# Patient Record
Sex: Male | Born: 1980 | Race: White | Hispanic: No | Marital: Married | Smoking: Current every day smoker
Health system: Southern US, Community
[De-identification: ages and names within clinical notes are randomized; demographics above are authoritative.]

## PROBLEM LIST (undated history)

## (undated) DIAGNOSIS — G894 Chronic pain syndrome: Secondary | ICD-10-CM

## (undated) DIAGNOSIS — G629 Polyneuropathy, unspecified: Secondary | ICD-10-CM

## (undated) HISTORY — PX: ELBOW SURGERY: SHX618

## (undated) HISTORY — PX: HIP SURGERY: SHX245

---

## 2013-02-26 DIAGNOSIS — I10 Essential (primary) hypertension: Secondary | ICD-10-CM | POA: Insufficient documentation

## 2017-06-08 DIAGNOSIS — G9050 Complex regional pain syndrome I, unspecified: Secondary | ICD-10-CM | POA: Insufficient documentation

## 2017-10-09 DIAGNOSIS — K0889 Other specified disorders of teeth and supporting structures: Secondary | ICD-10-CM | POA: Insufficient documentation

## 2017-10-09 DIAGNOSIS — Z720 Tobacco use: Secondary | ICD-10-CM | POA: Insufficient documentation

## 2018-12-26 ENCOUNTER — Ambulatory Visit
Admission: EM | Admit: 2018-12-26 | Discharge: 2018-12-26 | Disposition: A | Discharge status: Home / Self Care | Payer: 59

## 2018-12-26 ENCOUNTER — Telehealth: Payer: Self-pay

## 2018-12-26 ENCOUNTER — Other Ambulatory Visit: Payer: Self-pay

## 2018-12-26 DIAGNOSIS — R29898 Other symptoms and signs involving the musculoskeletal system: Secondary | ICD-10-CM

## 2018-12-26 DIAGNOSIS — M25512 Pain in left shoulder: Secondary | ICD-10-CM

## 2018-12-26 DIAGNOSIS — M542 Cervicalgia: Secondary | ICD-10-CM

## 2018-12-26 DIAGNOSIS — M62838 Other muscle spasm: Secondary | ICD-10-CM

## 2018-12-26 HISTORY — DX: Polyneuropathy, unspecified: G62.9

## 2018-12-26 MED ORDER — CYCLOBENZAPRINE HCL 10 MG PO TABS: 10.0000 mg | ORAL_TABLET | Freq: Every day | ORAL | 0 refills | Status: DC

## 2018-12-26 MED ORDER — PREDNISONE 10 MG PO TABS: ORAL_TABLET | ORAL | 0 refills | Status: DC

## 2018-12-26 NOTE — ED Triage Notes (Signed)
Pt presents to UC w/ left shoulder pain x4 days that shoots down left arm at times. Pt states movement makes pain worse. Pt states muscle spasms have started in his left chest muscle today. Denies sob.

## 2018-12-26 NOTE — ED Provider Notes (Signed)
[facility]     MRN: [id number] DOB: 02/20/1980  Subjective:   Marc Peterson is a 38 y.o. male presenting for 4 day hx acute onset of left sided lower neck pain that spans across his upper left back and penetrates into underarm area. Sx are getting worse. Has used lidocaine, has muscle spasms, radiates into lateral chest. Patient is not working. Does not do any strenuous physical activities, no falls or trauma. Sx started when he awoke in the morning 4 days ago. 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 He has typically had symptoms on his right side for this.   No current facility-administered medications for this encounter.   Current Outpatient Medications:  .  gabapentin (NEURONTIN) 800 MG tablet, Take 800 mg by mouth 4 (four) times daily., Disp: , Rfl:  .  cyclobenzaprine (FLEXERIL) 10 MG tablet, Take 1 tablet (10 mg total) by mouth at bedtime., Disp: 30 tablet, Rfl: 0 .  predniSONE (DELTASONE) 10 MG tablet, Day 1-5: Take 6 tablets daily. Day 6-10: take 3 tablets daily. Take medication with breakfast., Disp: 45 tablet, Rfl: 0   Allergies  Allergen Reactions  . Vioxx [Rofecoxib]     Past Medical History:  Diagnosis Date  . Neuropathy      Past Surgical History:  Procedure Laterality Date  . ELBOW SURGERY     right  . HIP SURGERY     x2, right hip    Family History  Problem Relation Age of Onset  . Cancer Mother   . Cancer Father     Social History   Tobacco Use  . Smoking status: Current Every Day Smoker    Packs/day: 1.00    Types: Cigarettes  . Smokeless tobacco: Never Used  Substance Use Topics  . Alcohol use: Yes    Comment: wine occasionally  . Drug use: Never    Review of Systems  Constitutional: Negative for fever and malaise/fatigue.  HENT: Negative for congestion, ear pain, sinus pain and sore throat.   Eyes: Negative for discharge and redness.  Respiratory: Negative for cough, hemoptysis, shortness of breath and  wheezing.   Cardiovascular: Positive for chest wall/muscle pain  Gastrointestinal: Negative for abdominal pain, diarrhea, nausea and vomiting.  Genitourinary: Negative for dysuria, flank pain and hematuria.  Musculoskeletal: As above.  Skin: Negative for rash.  Neurological: Negative for dizziness, weakness and headaches.  Psychiatric/Behavioral: Negative for depression and substance abuse.    Objective:   Vitals: BP (!) 145/91 (BP Location: Right Arm)   Pulse 95   Temp 98.5 F (36.9 C) (Oral)   Resp 16   SpO2 96%   Physical Exam Constitutional:      General: He is not in acute distress.    Appearance: Normal appearance. He is well-developed. He is not ill-appearing, toxic-appearing or diaphoretic.  HENT:     Head: Normocephalic and atraumatic.     Right Ear: External ear normal.     Left Ear: External ear normal.     Nose: Nose normal.     Mouth/Throat:     Mouth: Mucous membranes are moist.     Pharynx: Oropharynx is clear.  Eyes:     General: No scleral icterus.    Extraocular Movements: Extraocular movements intact.     Pupils: Pupils are equal, round, and reactive to light.  Cardiovascular:     Rate and Rhythm: Normal rate and regular rhythm.     Heart sounds: Normal heart sounds. No murmur. No friction  rub. No gallop.   Pulmonary:     Effort: Pulmonary effort is normal. No respiratory distress.     Breath sounds: Normal breath sounds. No stridor. No wheezing, rhonchi or rales.  Chest:     Chest wall: Tenderness (over areas outlined) present.    Musculoskeletal:     Left shoulder: He exhibits decreased range of motion (due to hesitation from his pain), tenderness (superficial along deltoid laterally and at [facility] joint), bony tenderness and spasm. He exhibits no swelling, no effusion, no crepitus, no deformity and normal strength.     Cervical back: He exhibits decreased range of motion (extension), tenderness and spasm (cervical paraspinal muscles). He exhibits no  bony tenderness, no swelling, no edema and no deformity.       Back:  Neurological:     Mental Status: He is alert and oriented to person, place, and time.     Cranial Nerves: No cranial nerve deficit.     Motor: No weakness.     Coordination: Coordination normal.     Deep Tendon Reflexes: Reflexes normal.  Psychiatric:        Mood and Affect: Mood normal.        Behavior: Behavior normal.        Thought Content: Thought content normal.        Judgment: Judgment normal.     Assessment and Plan :   1. Neck pain   2. Acute pain of left shoulder   3. Weakness of left hand   4. Muscle spasm     I do not suspect cardiopulmonary source for patient's symptoms given his lack of risk factors, age.  He also has a history of CRPS.  His symptoms are more consistent with variation of his CRPS, musculoskeletal type pain.  Due to the possibility of cervical radiculopathy, will use prednisone course and the muscle relaxant especially in light of the severity of his pain.  X-rays for the neck and shoulder are pending.  Strict ER precautions. Counseled patient on potential for adverse effects with medications prescribed today, patient verbalized understanding.    Jaynee Eagles, PA-C 12/26/18 [phone]

## 2018-12-27 ENCOUNTER — Ambulatory Visit (HOSPITAL_COMMUNITY)
Admission: RE | Admit: 2018-12-27 | Discharge: 2018-12-27 | Disposition: A | Discharge status: Home / Self Care | Payer: 59 | Source: Ambulatory Visit | Attending: Urgent Care | Admitting: Urgent Care

## 2018-12-27 DIAGNOSIS — M25512 Pain in left shoulder: Secondary | ICD-10-CM | POA: Insufficient documentation

## 2018-12-27 DIAGNOSIS — M542 Cervicalgia: Secondary | ICD-10-CM | POA: Insufficient documentation

## 2019-03-01 ENCOUNTER — Other Ambulatory Visit: Payer: Self-pay

## 2019-03-01 ENCOUNTER — Ambulatory Visit
Admission: EM | Admit: 2019-03-01 | Discharge: 2019-03-01 | Disposition: A | Discharge status: Home / Self Care | Payer: 59 | Attending: Emergency Medicine | Admitting: Emergency Medicine

## 2019-03-01 DIAGNOSIS — Z760 Encounter for issue of repeat prescription: Secondary | ICD-10-CM

## 2019-03-01 MED ORDER — NEURONTIN 800 MG PO TABS: 800.0000 mg | ORAL_TABLET | Freq: Four times a day (QID) | ORAL | 1 refills | Status: DC

## 2019-03-01 NOTE — ED Provider Notes (Signed)
[facility]    CSN: [id number] Arrival date & time: 03/01/19  1353      History   Chief Complaint Chief Complaint  Patient presents with  . Medication Refill    HPI Marc Peterson is a 39 y.o. male.   who presents for  medication refill.  Hx of RSD and CRPS.  States he has been on gabapentin 800 mg 4 times a day as a treatment plan for years.  From [place] and does not have a PCP.  Denies HA, vision changes, dizziness, lightheadedness, chest pain, shortness of breath, numbness or tingling in extremities, abdominal pain, changes in bowel or bladder habits.    The history is provided by the patient. No language interpreter was used.  Medication Refill   Past Medical History:  Diagnosis Date  . Neuropathy     There are no problems to display for this patient.   Past Surgical History:  Procedure Laterality Date  . ELBOW SURGERY     right  . HIP SURGERY     x2, right hip       Home Medications    Prior to Admission medications   Medication Sig Start Date End Date Taking? Authorizing Provider  cyclobenzaprine (FLEXERIL) 10 MG tablet Take 1 tablet (10 mg total) by mouth at bedtime. 12/26/18   Jaynee Eagles, PA-C  gabapentin (NEURONTIN) 800 MG tablet Take 800 mg by mouth 4 (four) times daily.    [provider]  predniSONE (DELTASONE) 10 MG tablet Day 1-5: Take 6 tablets daily. Day 6-10: take 3 tablets daily. Take medication with breakfast. 12/26/18   Jaynee Eagles, PA-C    Family History Family History  Problem Relation Age of Onset  . Cancer Mother   . Cancer Father     Social History Social History   Tobacco Use  . Smoking status: Current Every Day Smoker    Packs/day: 1.00    Types: Cigarettes  . Smokeless tobacco: Never Used  Substance Use Topics  . Alcohol use: Yes    Comment: wine occasionally  . Drug use: Never     Allergies   Vioxx [rofecoxib]   Review of Systems Review of Systems  Constitutional: Negative.     Respiratory: Negative.   Cardiovascular: Negative.   Neurological: Negative.   All other systems reviewed and are negative.    Physical Exam Triage Vital Signs ED Triage Vitals  Enc Vitals Group     BP      Pulse      Resp      Temp      Temp src      SpO2      Weight      Height      Head Circumference      Peak Flow      Pain Score      Pain Loc      Pain Edu?      Excl. in [place]?    No data found.  Updated Vital Signs There were no vitals taken for this visit.  Visual Acuity Right Eye Distance:   Left Eye Distance:   Bilateral Distance:    Right Eye Near:   Left Eye Near:    Bilateral Near:     Physical Exam Vitals and nursing note reviewed.  Constitutional:      General: He is not in acute distress.    Appearance: Normal appearance. He is normal weight. He is not ill-appearing, toxic-appearing or diaphoretic.  Cardiovascular:     Rate and Rhythm: Normal rate and regular rhythm.     Pulses: Normal pulses.     Heart sounds: Normal heart sounds. No murmur. No gallop.   Pulmonary:     Effort: Pulmonary effort is normal. No respiratory distress.     Breath sounds: Normal breath sounds. No stridor. No wheezing, rhonchi or rales.  Chest:     Chest wall: No tenderness.  Neurological:     General: No focal deficit present.     Mental Status: He is alert and oriented to person, place, and time.  Psychiatric:        Behavior: Behavior normal.      UC Treatments / Results  Labs (all labs ordered are listed, but only abnormal results are displayed) Labs Reviewed - No data to display  EKG   Radiology No results found.  Procedures Procedures (including critical care time)  Medications Ordered in UC Medications - No data to display  Initial Impression / Assessment and Plan / UC Course  I have reviewed the triage vital signs and the nursing notes.  Pertinent labs & imaging results that were available during my care of the patient were reviewed by  me and considered in my medical decision making (see chart for details).    Medication refill Gabapentin was prescribed Advised patient to establish care with PCP To return for worsening symptoms  Final Clinical Impressions(s) / UC Diagnoses   Final diagnoses:  Encounter for medication refill     Discharge Instructions     Gabapentin was prescribed Advised patient to establish care with a PCP    ED Prescriptions    None     PDMP not reviewed this encounter.   Durward Parcel, FNP 03/01/19 1411

## 2019-03-01 NOTE — ED Triage Notes (Signed)
Pt needs refill for gabapentin. Pt lives in [place] but has been staying here to help take care of his mom and is unable to contact pcp

## 2019-03-01 NOTE — Discharge Instructions (Addendum)
Gabapentin was prescribed Advised patient to establish care with a PCP

## 2020-02-14 ENCOUNTER — Other Ambulatory Visit: Payer: Self-pay

## 2020-02-14 ENCOUNTER — Ambulatory Visit
Admission: EM | Admit: 2020-02-14 | Discharge: 2020-02-14 | Disposition: A | Discharge status: Home / Self Care | Payer: 59 | Attending: Emergency Medicine | Admitting: Emergency Medicine

## 2020-02-14 ENCOUNTER — Encounter: Payer: Self-pay | Admitting: Emergency Medicine

## 2020-02-14 DIAGNOSIS — B9789 Other viral agents as the cause of diseases classified elsewhere: Secondary | ICD-10-CM | POA: Diagnosis not present

## 2020-02-14 DIAGNOSIS — H6593 Unspecified nonsuppurative otitis media, bilateral: Secondary | ICD-10-CM

## 2020-02-14 DIAGNOSIS — J329 Chronic sinusitis, unspecified: Secondary | ICD-10-CM

## 2020-02-14 DIAGNOSIS — Z1152 Encounter for screening for COVID-19: Secondary | ICD-10-CM

## 2020-02-14 MED ORDER — FLUTICASONE PROPIONATE 50 MCG/ACT NA SUSP: 1.0000 | Freq: Every day | NASAL | 0 refills | Status: DC

## 2020-02-14 MED ORDER — PREDNISONE 10 MG PO TABS: 20.0000 mg | ORAL_TABLET | Freq: Every day | ORAL | 0 refills | Status: DC

## 2020-02-14 NOTE — ED Provider Notes (Signed)
[facility]   [id number] 02/14/20 Arrival Time: 1553  CC:EAR PAIN  SUBJECTIVE: History from: patient and family.  Marc Peterson is a 40 y.o. male who presented to the urgent care with a complaint of left ear pain, scratchy throat, and sinus pressure for the past 4 to 5 days.  Denies a precipitating event, such as swimming or wearing ear plugs.  Patient states the pain is constant and achy in character.  Patient has tried OTC medication without relief.  Symptoms are made worse with lying down.  Denies similar symptoms in the past.    Denies fever, chills, fatigue, sinus pain, rhinorrhea, ear discharge, sore throat, SOB, wheezing, chest pain, nausea, changes in bowel or bladder habits.    ROS: As per HPI.  All other pertinent ROS negative.     Past Medical History:  Diagnosis Date  . Neuropathy    Past Surgical History:  Procedure Laterality Date  . ELBOW SURGERY     right  . HIP SURGERY     x2, right hip   Allergies  Allergen Reactions  . Vioxx [Rofecoxib]    No current facility-administered medications on file prior to encounter.   Current Outpatient Medications on File Prior to Encounter  Medication Sig Dispense Refill  . cyclobenzaprine (FLEXERIL) 10 MG tablet Take 1 tablet (10 mg total) by mouth at bedtime. 30 tablet 0  . NEURONTIN 800 MG tablet Take 1 tablet (800 mg total) by mouth in the morning, at noon, in the evening, and at bedtime. 120 tablet 1   Social History   Socioeconomic History  . Marital status: Married    Spouse name: Not on file  . Number of children: Not on file  . Years of education: Not on file  . Highest education level: Not on file  Occupational History  . Not on file  Tobacco Use  . Smoking status: Current Every Day Smoker    Packs/day: 1.00    Types: Cigarettes  . Smokeless tobacco: Never Used  Substance and Sexual Activity  . Alcohol use: Yes    Comment: wine occasionally  . Drug use: Never  . Sexual activity: Not on  file  Other Topics Concern  . Not on file  Social History Narrative  . Not on file   Social Determinants of Health   Financial Resource Strain: Not on file  Food Insecurity: Not on file  Transportation Needs: Not on file  Physical Activity: Not on file  Stress: Not on file  Social Connections: Not on file  Intimate Partner Violence: Not on file   Family History  Problem Relation Age of Onset  . Cancer Mother   . Cancer Father     OBJECTIVE:  Vitals:   02/14/20 1602 02/14/20 1604  BP:  135/86  Pulse:  73  Resp:  18  Temp:  98.5 F (36.9 C)  TempSrc:  Oral  SpO2:  96%  Weight: 190 lb (86.2 kg)   Height: 6\' 2"  (1.88 m)      Physical Exam Vitals and nursing note reviewed.  Constitutional:      General: He is not in acute distress.    Appearance: Normal appearance. He is normal weight. He is not ill-appearing, toxic-appearing or diaphoretic.  HENT:     Right Ear: Ear canal and external ear normal. A middle ear effusion is present. There is no impacted cerumen.     Left Ear: Ear canal and external ear normal. A middle ear effusion is present.  There is no impacted cerumen.     Nose: No congestion.     Right Sinus: Maxillary sinus tenderness present.     Left Sinus: Maxillary sinus tenderness present.  Cardiovascular:     Rate and Rhythm: Normal rate and regular rhythm.     Pulses: Normal pulses.     Heart sounds: Normal heart sounds. No murmur heard. No friction rub. No gallop.   Pulmonary:     Effort: Pulmonary effort is normal. No respiratory distress.     Breath sounds: Normal breath sounds. No stridor. No wheezing, rhonchi or rales.  Chest:     Chest wall: No tenderness.  Neurological:     Mental Status: He is alert and oriented to person, place, and time.     Imaging: No results found.   ASSESSMENT & PLAN:  1. Middle ear effusion, bilateral   2. Encounter for screening for COVID-19   3. Viral sinusitis     Meds ordered this encounter   Medications  . fluticasone (FLONASE) 50 MCG/ACT nasal spray    Sig: Place 1 spray into both nostrils daily for 14 days.    Dispense:  16 g    Refill:  0  . predniSONE (DELTASONE) 10 MG tablet    Sig: Take 2 tablets (20 mg total) by mouth daily.    Dispense:  15 tablet    Refill:  0   Discharge instructions  COVID-19 testing ordered.  It will take 2 to 7 days for results to return and someone will call if your result is positive.  Rest and drink plenty of fluids Prescribed Flonase and prednisone Use OTC Halls lozenges to soothe throat Gargle with salty warm water daily Take medications as directed and to completion Continue to use OTC ibuprofen and/ or tylenol as needed for pain control Follow up with PCP if symptoms persists Return here or go to the ER if you have any new or worsening symptoms   Reviewed expectations re: course of current medical issues. Questions answered. Outlined signs and symptoms indicating need for more acute intervention. Patient verbalized understanding. After Visit Summary given.         Durward Parcel, FNP 02/14/20 [phone]

## 2020-02-14 NOTE — Discharge Instructions (Signed)
COVID-19 testing ordered.  It will take 2 to 7 days for results to return and someone will call if your result is positive.  Rest and drink plenty of fluids Prescribed Flonase and prednisone Use OTC Halls lozenges to soothe throat Gargle with salty warm water daily Take medications as directed and to completion Continue to use OTC ibuprofen and/ or tylenol as needed for pain control Follow up with PCP if symptoms persists Return here or go to the ER if you have any new or worsening symptom

## 2020-02-14 NOTE — ED Triage Notes (Signed)
LT ear pain since Monday

## 2020-02-16 LAB — NOVEL CORONAVIRUS, NAA: SARS-CoV-2, NAA: DETECTED — AB

## 2020-02-16 LAB — SARS-COV-2, NAA 2 DAY TAT

## 2020-02-17 ENCOUNTER — Telehealth: Payer: Self-pay | Admitting: *Deleted

## 2020-02-17 NOTE — Telephone Encounter (Signed)
Called to discuss with patient about COVID-19 symptoms and the use of one of the available treatments for those with mild to moderate Covid symptoms and at a high risk of hospitalization.  Pt appears to qualify for outpatient treatment due to co-morbid conditions and/or a member of an at-risk group in accordance with the FDA Emergency Use Authorization.    Symptom onset:  Vaccinated:  Booster?  Immunocompromised?  Qualifiers:   Unable to reach pt - Left VM to return call for information  Vallery Mcdade Kaye   

## 2020-03-11 ENCOUNTER — Other Ambulatory Visit: Payer: Self-pay

## 2020-03-11 ENCOUNTER — Ambulatory Visit
Admission: EM | Admit: 2020-03-11 | Discharge: 2020-03-11 | Disposition: A | Discharge status: Home / Self Care | Payer: 59 | Attending: Physician Assistant | Admitting: Physician Assistant

## 2020-03-11 ENCOUNTER — Encounter: Payer: Self-pay | Admitting: Emergency Medicine

## 2020-03-11 DIAGNOSIS — H6691 Otitis media, unspecified, right ear: Secondary | ICD-10-CM

## 2020-03-11 MED ORDER — CEFDINIR 300 MG PO CAPS: 300.0000 mg | ORAL_CAPSULE | Freq: Two times a day (BID) | ORAL | 0 refills | Status: DC

## 2020-03-11 NOTE — Discharge Instructions (Signed)
Return if any problems.

## 2020-03-11 NOTE — ED Triage Notes (Signed)
Sore throat with RT ear pain x  1 week.

## 2020-03-13 NOTE — ED Provider Notes (Signed)
[facility]    CSN: [id number] Arrival date & time: 03/11/20  1051      History   Chief Complaint No chief complaint on file.   HPI Marc Peterson is a 40 y.o. male.   The history is provided by the patient. No language interpreter was used.  Otalgia Location:  Right Quality:  Aching Severity:  Moderate Onset quality:  Gradual Timing:  Constant Progression:  Worsening Chronicity:  New Ineffective treatments:  None tried Associated symptoms: no sore throat     Past Medical History:  Diagnosis Date  . Neuropathy     There are no problems to display for this patient.   Past Surgical History:  Procedure Laterality Date  . ELBOW SURGERY     right  . HIP SURGERY     x2, right hip       Home Medications    Prior to Admission medications   Medication Sig Start Date End Date Taking? Authorizing Provider  cefdinir (OMNICEF) 300 MG capsule Take 1 capsule (300 mg total) by mouth 2 (two) times daily. 03/11/20  Yes Cheron Schaumann K, PA-C  cyclobenzaprine (FLEXERIL) 10 MG tablet Take 1 tablet (10 mg total) by mouth at bedtime. 12/26/18   Wallis Bamberg, PA-C  fluticasone (FLONASE) 50 MCG/ACT nasal spray Place 1 spray into both nostrils daily for 14 days. 02/14/20 02/28/20  Avegno, Zachery Dakins, FNP  NEURONTIN 800 MG tablet Take 1 tablet (800 mg total) by mouth in the morning, at noon, in the evening, and at bedtime. 03/01/19 04/30/19  Avegno, Zachery Dakins, FNP  predniSONE (DELTASONE) 10 MG tablet Take 2 tablets (20 mg total) by mouth daily. 02/14/20   Durward Parcel, FNP    Family History Family History  Problem Relation Age of Onset  . Cancer Mother   . Cancer Father     Social History Social History   Tobacco Use  . Smoking status: Current Every Day Smoker    Packs/day: 1.00    Types: Cigarettes  . Smokeless tobacco: Never Used  Substance Use Topics  . Alcohol use: Yes    Comment: wine occasionally  . Drug use: Never     Allergies   Vioxx  [rofecoxib]   Review of Systems Review of Systems  HENT: Positive for ear pain. Negative for sore throat.   All other systems reviewed and are negative.    Physical Exam Triage Vital Signs ED Triage Vitals  Enc Vitals Group     BP 03/11/20 1107 (!) 155/88     Pulse Rate 03/11/20 1107 88     Resp 03/11/20 1107 17     Temp 03/11/20 1107 98.4 F (36.9 C)     Temp Source 03/11/20 1107 Tympanic     SpO2 03/11/20 1107 97 %     Weight --      Height --      Head Circumference --      Peak Flow --      Pain Score 03/11/20 1122 0     Pain Loc --      Pain Edu? --      Excl. in [place]? --    No data found.  Updated Vital Signs BP (!) 155/88 (BP Location: Right Arm)   Pulse 88   Temp 98.4 F (36.9 C) (Tympanic)   Resp 17   SpO2 97%   Visual Acuity Right Eye Distance:   Left Eye Distance:   Bilateral Distance:    Right Eye Near:  Left Eye Near:    Bilateral Near:     Physical Exam Vitals and nursing note reviewed.  Constitutional:      Appearance: He is well-developed and well-nourished.  HENT:     Head: Normocephalic and atraumatic.     Ears:     Comments: Right tm erythematous  Eyes:     Conjunctiva/sclera: Conjunctivae normal.  Cardiovascular:     Rate and Rhythm: Normal rate and regular rhythm.     Heart sounds: No murmur heard.   Pulmonary:     Effort: Pulmonary effort is normal. No respiratory distress.     Breath sounds: Normal breath sounds.  Abdominal:     Palpations: Abdomen is soft.     Tenderness: There is no abdominal tenderness.  Musculoskeletal:        General: No edema.     Cervical back: Neck supple.  Skin:    General: Skin is warm and dry.  Neurological:     General: No focal deficit present.     Mental Status: He is alert.  Psychiatric:        Mood and Affect: Mood and affect and mood normal.      UC Treatments / Results  Labs (all labs ordered are listed, but only abnormal results are displayed) Labs Reviewed - No data to  display  EKG   Radiology No results found.  Procedures Procedures (including critical care time)  Medications Ordered in UC Medications - No data to display  Initial Impression / Assessment and Plan / UC Course  I have reviewed the triage vital signs and the nursing notes.  Pertinent labs & imaging results that were available during my care of the patient were reviewed by me and considered in my medical decision making (see chart for details).     MDM:  Pt counseled on ear infections.  Pt advised to recheck with primary care.  Final Clinical Impressions(s) / UC Diagnoses   Final diagnoses:  Right otitis media, unspecified otitis media type     Discharge Instructions     Return if any problems.    ED Prescriptions    Medication Sig Dispense Auth. Provider   cefdinir (OMNICEF) 300 MG capsule Take 1 capsule (300 mg total) by mouth 2 (two) times daily. 20 capsule Elson Areas, [place]     PDMP not reviewed this encounter.  An After Visit Summary was printed and given to the patient.    Elson Areas, [place] 03/13/20 [phone]

## 2020-05-05 ENCOUNTER — Other Ambulatory Visit: Payer: Self-pay

## 2020-05-05 ENCOUNTER — Encounter (HOSPITAL_COMMUNITY): Payer: Self-pay | Admitting: *Deleted

## 2020-05-05 ENCOUNTER — Emergency Department (HOSPITAL_COMMUNITY)
Admission: EM | Admit: 2020-05-05 | Discharge: 2020-05-05 | Disposition: A | Discharge status: Home / Self Care | Payer: 59 | Attending: Emergency Medicine | Admitting: Emergency Medicine

## 2020-05-05 ENCOUNTER — Emergency Department (HOSPITAL_COMMUNITY): Payer: 59

## 2020-05-05 DIAGNOSIS — K769 Liver disease, unspecified: Secondary | ICD-10-CM

## 2020-05-05 DIAGNOSIS — R110 Nausea: Secondary | ICD-10-CM

## 2020-05-05 DIAGNOSIS — F17210 Nicotine dependence, cigarettes, uncomplicated: Secondary | ICD-10-CM | POA: Diagnosis not present

## 2020-05-05 DIAGNOSIS — D72829 Elevated white blood cell count, unspecified: Secondary | ICD-10-CM | POA: Diagnosis not present

## 2020-05-05 DIAGNOSIS — R1032 Left lower quadrant pain: Secondary | ICD-10-CM | POA: Diagnosis present

## 2020-05-05 LAB — URINALYSIS, ROUTINE W REFLEX MICROSCOPIC
Bilirubin Urine: NEGATIVE
Glucose, UA: NEGATIVE mg/dL
Hgb urine dipstick: NEGATIVE
Ketones, ur: NEGATIVE mg/dL
Leukocytes,Ua: NEGATIVE
Nitrite: NEGATIVE
Protein, ur: NEGATIVE mg/dL
Specific Gravity, Urine: 1.008 (ref 1.005–1.030)
pH: 6 (ref 5.0–8.0)

## 2020-05-05 LAB — COMPREHENSIVE METABOLIC PANEL
ALT: 16 U/L (ref 0–44)
AST: 17 U/L (ref 15–41)
Albumin: 4.4 g/dL (ref 3.5–5.0)
Alkaline Phosphatase: 54 U/L (ref 38–126)
Anion gap: 8 (ref 5–15)
BUN: 8 mg/dL (ref 6–20)
CO2: 27 mmol/L (ref 22–32)
Calcium: 9 mg/dL (ref 8.9–10.3)
Chloride: 103 mmol/L (ref 98–111)
Creatinine, Ser: 0.73 mg/dL (ref 0.61–1.24)
GFR, Estimated: >60 mL/min (ref >60)
Glucose, Bld: 86 mg/dL (ref 70–99)
Potassium: 3.8 mmol/L (ref 3.5–5.1)
Sodium: 138 mmol/L (ref 135–145)
Total Bilirubin: 0.9 mg/dL (ref 0.3–1.2)
Total Protein: 7.5 g/dL (ref 6.5–8.1)

## 2020-05-05 LAB — CBC WITH DIFFERENTIAL/PLATELET
Abs Immature Granulocytes: 0.05 10*3/uL (ref 0.00–0.07)
Basophils Absolute: 0.1 10*3/uL (ref 0.0–0.1)
Basophils Relative: 1 %
Eosinophils Absolute: 0.1 10*3/uL (ref 0.0–0.5)
Eosinophils Relative: 1 %
HCT: 46.7 % (ref 39.0–52.0)
Hemoglobin: 15.2 g/dL (ref 13.0–17.0)
Immature Granulocytes: 0 %
Lymphocytes Relative: 17 %
Lymphs Abs: 2.4 10*3/uL (ref 0.7–4.0)
MCH: 29.4 pg (ref 26.0–34.0)
MCHC: 32.5 g/dL (ref 30.0–36.0)
MCV: 90.3 fL (ref 80.0–100.0)
Monocytes Absolute: 1 10*3/uL (ref 0.1–1.0)
Monocytes Relative: 7 %
Neutro Abs: 10.7 10*3/uL — ABNORMAL HIGH (ref 1.7–7.7)
Neutrophils Relative %: 74 %
Platelets: 223 10*3/uL (ref 150–400)
RBC: 5.17 MIL/uL (ref 4.22–5.81)
RDW: 14.6 % (ref 11.5–15.5)
WBC: 14.4 10*3/uL — ABNORMAL HIGH (ref 4.0–10.5)
nRBC: 0 % (ref 0.0–0.2)

## 2020-05-05 LAB — LIPASE, BLOOD: Lipase: 22 U/L (ref 11–51)

## 2020-05-05 MED ORDER — IOHEXOL 300 MG/ML  SOLN
100.0000 mL | Freq: Once | INTRAMUSCULAR | Status: AC | PRN
  Administered 2020-05-05: 100 mL via INTRAVENOUS

## 2020-05-05 MED ORDER — NAPROXEN 500 MG PO TABS: 500.0000 mg | ORAL_TABLET | Freq: Two times a day (BID) | ORAL | 0 refills | Status: DC

## 2020-05-05 MED ORDER — ONDANSETRON HCL 4 MG/2ML IJ SOLN
4.0000 mg | Freq: Once | INTRAMUSCULAR | Status: AC
  Administered 2020-05-05: 4 mg via INTRAVENOUS
  Filled 2020-05-05: qty 2

## 2020-05-05 MED ORDER — MORPHINE SULFATE (PF) 4 MG/ML IV SOLN
4.0000 mg | Freq: Once | INTRAVENOUS | Status: AC
  Administered 2020-05-05: 4 mg via INTRAVENOUS
  Filled 2020-05-05: qty 1

## 2020-05-05 NOTE — ED Triage Notes (Signed)
Emergency Medicine Provider Triage Evaluation Note  Marc Peterson , a 40 y.o. male  was evaluated in triage.  Pt complains of sudden onset, constant, worsening, left groin as well as right lower quadrant abdominal pain.  He reports he had mesh in this area placed in 2013 for hernia.  He feels like the mesh may have ruptured.  He complains of nausea and severe pain, no vomiting.  He reports that when he has a bowel movement he feels a bulge in the left groin area consistent with past hernia.  He denies any fevers or chills or skin changes.   Review of Systems  Positive: + abdominal pain, + nausea Negative: - vomiting, - color change, - fevers  Physical Exam  BP (!) 158/78 (BP Location: Right Arm)   Pulse 98   Temp 98.4 F (36.9 C) (Oral)   Resp 20   SpO2 100%  Gen:   Awake, no distress   HEENT:  Atraumatic  Resp:  Normal effort  Cardiac:  Normal rate  Abd:   Nondistended, + RLQ abdominal TTP as well as left inguinal TTP. No hernia appreciated. No overlying skin changes. No testicular pain.  MSK:   Moves extremities without difficulty  Neuro:  Speech clear   Medical Decision Making  Medically screening exam initiated at 3:30 PM.  Appropriate orders placed.  Marc Peterson was informed that the remainder of the evaluation will be completed by another provider, this initial triage assessment does not replace that evaluation, and the importance of remaining in the ED until their evaluation is complete.  Clinical Impression  40 year old male who presents to the ED today with complaint of possible rupture mesh from past hernia repair in 2013.  Began having left inguinal pain as well as right lower quadrant pain yesterday.  On arrival to the ED vitals are stable.  Patient does appears to be in pain at this time.  He has tenderness palpation to the left inguinal area.  No obvious hernia appreciated.  No overlying skin changes to suggest incarcerated or strangulated hernia however patient will  need a CT scan at this time.  Testicular exam performed with chaperone at bedside in triage without any testicular involvement at this time.  No concern for torsion.   Tanda Rockers, PA-C 05/05/20 1530

## 2020-05-05 NOTE — Discharge Instructions (Addendum)
Please pick up medications and take as prescribed. Please follow up with a PCP for further evaluation. Attached is a list of providers in the area.   The CT scan did show a 2.3 cm liver lesion that is most likely benign and not the cause of your pain today. It is recommended that you have an MRI of the abdomen in the outpatient setting in the future for further evaluation. Your PCP can schedule this for you.   Return to the ED for any worsening symptoms  [facility] Doctor List    Kari Baars MD. Specialty: Pulmonary Disease Contact information: [address]  [address]  [place] [address]  [phone]   Syliva Overman, MD. Specialty: [facility] Medicine Contact information: [address]  [place] [address]  [phone]   Lilyan Punt, MD. Specialty: [facility] Medicine Contact information: [address]  [place] [address]  [phone]   Avon Gully, MD Specialty: Internal Medicine Contact information: [address] [place] [address]  [phone]   Catalina Pizza, MD. Specialty: Internal Medicine Contact information: [address]  [place] [address]  [phone]    [facility] (Dr. Selena Batten) Specialty: Family Medicine Contact information: [address]  [place] [address]  [phone]   John Giovanni, MD. Specialty: [facility] Medicine Contact information: [address]  [address]  [place] [address]  [phone]   Carylon Perches, MD. Specialty: Internal Medicine Contact information: [address]  [address]  [place] [address]  [phone]    [facility]  [address] [place], [place] [address] [phone]  Services [facility] offers a variety of basic health services.  Services include but are not limited to: Blood pressure checks  Heart rate checks  Blood sugar checks  Urine analysis  Rapid  strep tests  Pregnancy tests.  Health education and referrals  People needing more complex services will be directed to a physician online. Using these virtual visits, doctors can evaluate and prescribe medicine and treatments. There will be no medication on-site, though [facility] will help patients fill their prescriptions at little to no cost.   For More information please go to: [email or web address]

## 2020-05-05 NOTE — ED Provider Notes (Signed)
Patient is a well-appearing 40 year old male complaining of left inguinal pain and right lower quadrant pain, the right lower quadrant pain is been going on for a couple of weeks, the left inguinal pain for couple of days, though he does have some tenderness in the left inguinal region there is no obvious lymphadenopathy, no redness, no hernia, scrotum is normal, right lower quadrant is minimally tender, the patient is otherwise well with normal vital signs.  Labs and CT scan reviewed, unremarkable, patient stable for discharge, agreeable to follow-up and return for worsening  Medical screening examination/treatment/procedure(s) were conducted as a shared visit with non-physician practitioner(s) and myself.  I personally evaluated the patient during the encounter.  Clinical Impression:   Final diagnoses:  Left lower quadrant abdominal pain  Nausea  Liver lesion         Marc Hong, MD 05/08/20 1643

## 2020-05-05 NOTE — ED Notes (Signed)
Pt returned from CT °

## 2020-05-05 NOTE — ED Provider Notes (Signed)
[facility] EMERGENCY DEPARTMENT Provider Note   CSN: [id number] Arrival date & time: 05/05/20  1421     History Chief Complaint  Patient presents with  . Groin Pain    Si RaiderMelvin Peterson is a 40 y.o. male who presents to the ED today with complaint of sudden onset, constant, worsening, left groin as well as right lower quadrant abdominal pain.  He reports he had mesh in this area placed in 2013 for hernia. He feels like the mesh may have ruptured.  He complains of nausea and severe pain, no vomiting.  He reports that when he has a bowel movement he feels a bulge in the left groin area consistent with past hernia.  He denies any fevers or chills or skin changes. No testicular pain or swelling. No other complaints at this time.   The history is provided by the patient and medical records.       Past Medical History:  Diagnosis Date  . Neuropathy     There are no problems to display for this patient.   Past Surgical History:  Procedure Laterality Date  . ELBOW SURGERY     right  . HIP SURGERY     x2, right hip       Family History  Problem Relation Age of Onset  . Cancer Mother   . Cancer Father     Social History   Tobacco Use  . Smoking status: Current Every Day Smoker    Packs/day: 1.00    Types: Cigarettes  . Smokeless tobacco: Never Used  Substance Use Topics  . Alcohol use: Yes    Comment: wine occasionally  . Drug use: Never    Home Medications Prior to Admission medications   Medication Sig Start Date End Date Taking? Authorizing Provider  naproxen (NAPROSYN) 500 MG tablet Take 1 tablet (500 mg total) by mouth 2 (two) times daily. 05/05/20  Yes Jacqualyn Sedgwick, PA-C  cefdinir (OMNICEF) 300 MG capsule Take 1 capsule (300 mg total) by mouth 2 (two) times daily. 03/11/20   Elson AreasSofia, Leslie K, PA-C  cyclobenzaprine (FLEXERIL) 10 MG tablet Take 1 tablet (10 mg total) by mouth at bedtime. 12/26/18   Wallis BambergMani, Mario, PA-C  fluticasone (FLONASE) 50 MCG/ACT nasal  spray Place 1 spray into both nostrils daily for 14 days. 02/14/20 02/28/20  Avegno, Zachery DakinsKomlanvi S, FNP  NEURONTIN 800 MG tablet Take 1 tablet (800 mg total) by mouth in the morning, at noon, in the evening, and at bedtime. 03/01/19 04/30/19  Avegno, Zachery DakinsKomlanvi S, FNP  predniSONE (DELTASONE) 10 MG tablet Take 2 tablets (20 mg total) by mouth daily. 02/14/20   Durward ParcelAvegno, Komlanvi S, FNP    Allergies    Vioxx [rofecoxib]  Review of Systems   Review of Systems  Constitutional: Negative for chills and fever.  Gastrointestinal: Positive for abdominal pain and nausea. Negative for constipation, diarrhea and vomiting.  Genitourinary: Negative for penile pain, penile swelling, scrotal swelling and testicular pain.  All other systems reviewed and are negative.   Physical Exam Updated Vital Signs BP (!) 143/88 (BP Location: Right Arm)   Pulse 72   Temp 98.1 F (36.7 C) (Oral)   Resp 16   SpO2 100%   Physical Exam Vitals and nursing note reviewed.  Constitutional:      Appearance: He is not ill-appearing.     Comments: Uncomfortable appearing male  HENT:     Head: Normocephalic and atraumatic.  Eyes:     Conjunctiva/sclera: Conjunctivae normal.  Cardiovascular:  Rate and Rhythm: Normal rate and regular rhythm.     Pulses: Normal pulses.  Pulmonary:     Effort: Pulmonary effort is normal.     Breath sounds: Normal breath sounds. No wheezing, rhonchi or rales.  Abdominal:     Palpations: Abdomen is soft.     Tenderness: There is abdominal tenderness. There is no right CVA tenderness, left CVA tenderness, guarding or rebound.     Comments: Soft, + left inguinal and RLQ abdominal TTP; no overlying skin changes; +BS throughout, no r/g/r, neg murphy's, neg mcburney's, no CVA TTP  Musculoskeletal:     Cervical back: Neck supple.  Skin:    General: Skin is warm and dry.  Neurological:     Mental Status: He is alert.     ED Results / Procedures / Treatments   Labs (all labs ordered are  listed, but only abnormal results are displayed) Labs Reviewed  CBC WITH DIFFERENTIAL/PLATELET - Abnormal; Notable for the following components:      Result Value   WBC 14.4 (*)    Neutro Abs 10.7 (*)    All other components within normal limits  COMPREHENSIVE METABOLIC PANEL  LIPASE, BLOOD  URINALYSIS, ROUTINE W REFLEX MICROSCOPIC  LACTIC ACID, PLASMA  LACTIC ACID, PLASMA    EKG None  Radiology CT Abdomen Pelvis W Contrast  Result Date: 05/05/2020 CLINICAL DATA:  Lower abdominal pain. EXAM: CT ABDOMEN AND PELVIS WITH CONTRAST TECHNIQUE: Multidetector CT imaging of the abdomen and pelvis was performed using the standard protocol following bolus administration of intravenous contrast. CONTRAST:  OMNIPAQUE IOHEXOL 300 MG/ML  SOLN COMPARISON:  None. FINDINGS: Lower chest: The lung bases are clear of acute process. No pleural effusion or pulmonary lesions. The heart is normal in size. No pericardial effusion. The distal esophagus and aorta are unremarkable. Hepatobiliary: There is a 2.3 cm lesion projecting off the inferior aspect of segment 6. Because of the patient's age no delayed images were performed but I think this is most likely a hepatic hemangioma. No other hepatic lesions are identified. No intrahepatic biliary dilatation. The gallbladder is unremarkable. No common bile duct dilatation. Pancreas: No mass, inflammation or ductal dilatation. Spleen: Normal size.  No focal lesions. Adrenals/Urinary Tract: Adrenal glands and kidneys are unremarkable. The bladder is unremarkable. Stomach/Bowel: The stomach, duodenum, small bowel and colon are unremarkable. No acute inflammatory changes, mass lesions or obstructive findings. The terminal ileum is normal. The appendix is normal. Vascular/Lymphatic: Age advanced atherosclerotic calcifications involving the aorta common iliac arteries and right renal artery. No aneurysm or dissection. The branch vessels are patent. The major venous structures  are patent. Small scattered mesenteric and retroperitoneal lymph nodes but no mass or adenopathy. Reproductive: The prostate gland and seminal vesicles are unremarkable. Other: Small scattered inguinal lymph nodes. No free abdominal/pelvic fluid collections. No abdominal wall hernia or inguinal hernia. Musculoskeletal: Right total hip arthroplasty is noted with moderate associated artifact. No complicating features are identified. The bony pelvis is intact and the lumbar spine is unremarkable. IMPRESSION: 1. No acute abdominal/pelvic findings, mass lesions or adenopathy. 2. 2.3 cm lesion projecting off the inferior aspect of segment 6 of the liver. This is most likely a hepatic hemangioma. MRI abdomen without and with contrast would be be helpful for more definitive evaluation. 3. Age advanced atherosclerotic calcifications involving the aorta and common iliac arteries and right renal artery. 4. 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 Aortic Atherosclerosis (ICD10-I70.0). Electronically Signed   By: Orlene Plum.D.  On: 05/05/2020 18:26    Procedures Procedures   Medications Ordered in ED Medications  morphine 4 MG/ML injection 4 mg (4 mg Intravenous Given 05/05/20 1724)  ondansetron (ZOFRAN) injection 4 mg (4 mg Intravenous Given 05/05/20 1724)  iohexol (OMNIPAQUE) 300 MG/ML solution 100 mL (100 mLs Intravenous Contrast Given 05/05/20 1801)    ED Course  I have reviewed the triage vital signs and the nursing notes.  Pertinent labs & imaging results that were available during my care of the patient were reviewed by me and considered in my medical decision making (see chart for details).  Clinical Course as of 05/05/20 1923  Tue May 05, 2020  1738 WBC(!): 14.4 [MV]    Clinical Course User Index [MV] Tanda Rockers, [place]   MDM Rules/Calculators/A&P                          40 year old male who presents to the ED today with complaint of left lower quadrant and right  lower quadrant abdominal pain that began yesterday.  History of mesh repair in 2013 in these exact areas and is concerned something could be wrong.  He was initially medically screened by myself with plans for lab work and CT scan.  Patient was taken into triage room during medical screening, no testicular involvement at this time.  No concern for torsion.   CBC with a leukocytosis of 14,400.  Hemoglobin stable. Remainder of work-up reassuring.  Lipase is negative.  No electrolyte abnormalities.  Urinalysis without signs of infection or hemoglobin.  CT scan: IMPRESSION:  1. No acute abdominal/pelvic findings, mass lesions or adenopathy.  2. 2.3 cm lesion projecting off the inferior aspect of segment 6 of  the liver. This is most likely a hepatic hemangioma. MRI abdomen  without and with contrast would be be helpful for more definitive  evaluation.  3. Age advanced atherosclerotic calcifications involving the aorta  and common iliac arteries and right renal artery.  4. 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   On reevaluation patient still complaining of pain.  Attending physician Dr. Hyacinth Meeker has evaluated patient as well.  He recommends following up with a PCP and anti-inflammatories for pain at this time.  Will discharge home at this time with PCP follow-up.  Patient instructed to return to the ED for any worsening symptoms.   This note was prepared using Dragon voice recognition software and may include unintentional dictation errors due to the inherent limitations of voice recognition software.  Final Clinical Impression(s) / ED Diagnoses Final diagnoses:  Left lower quadrant abdominal pain  Nausea  Liver lesion    Rx / DC Orders ED Discharge Orders         Ordered    naproxen (NAPROSYN) 500 MG tablet  2 times daily        05/05/20 1917           Discharge Instructions     Please pick up medications and take as prescribed. Please follow up with a PCP for  further evaluation. Attached is a list of providers in the area.   The CT scan did show a 2.3 cm liver lesion that is most likely benign and not the cause of your pain today. It is recommended that you have an MRI of the abdomen in the outpatient setting in the future for further evaluation. Your PCP can schedule this for you.   Return to the ED for any worsening symptoms  [place]  Primary Care Doctor List    Kari Baars MD. Specialty: Pulmonary Disease Contact information: [address]  [address]  [place] [address]  [phone]   Syliva Overman, MD. Specialty: [facility] Medicine Contact information: [address]  [place] [address]  [phone]   Lilyan Punt, MD. Specialty: [facility] Medicine Contact information: [address]  [place] [address]  [phone]   Avon Gully, MD Specialty: Internal Medicine Contact information: [address] [place] [address]  [phone]   Catalina Pizza, MD. Specialty: Internal Medicine Contact information: [address]  [place] [address]  [phone]    [facility] (Dr. Selena Batten) Specialty: Family Medicine Contact information: [address]  [place] [address]  [phone]   John Giovanni, MD. Specialty: [facility] Medicine Contact information: [address]  [address]  [place] [address]  [phone]   Carylon Perches, MD. Specialty: Internal Medicine Contact information: [address]  [address]  [place] [address]  [phone]    [facility]  [address] [place], [place] [address] [phone]  Services [facility] offers a variety of basic health services.  Services include but are not limited to: Blood pressure checks  Heart rate checks  Blood sugar checks  Urine analysis  Rapid strep tests  Pregnancy tests.  Health education and referrals  People needing  more complex services will be directed to a physician online. Using these virtual visits, doctors can evaluate and prescribe medicine and treatments. There will be no medication on-site, though [facility] will help patients fill their prescriptions at little to no cost.   For More information please go to: [email or web address]        Tanda Rockers, PA-C 05/05/20 Bobby Rumpf, MD 05/08/20 [phone]

## 2020-05-05 NOTE — ED Triage Notes (Signed)
States he may have ruptured a mesh he had placed in groin area 2013

## 2020-06-16 ENCOUNTER — Telehealth (HOSPITAL_COMMUNITY): Payer: Self-pay

## 2020-06-16 NOTE — Telephone Encounter (Signed)
Called pt to verify PCP, no answer, left vm. AW

## 2020-07-23 ENCOUNTER — Encounter: Payer: Self-pay | Admitting: Emergency Medicine

## 2020-07-23 ENCOUNTER — Other Ambulatory Visit: Payer: Self-pay

## 2020-07-23 ENCOUNTER — Ambulatory Visit
Admission: EM | Admit: 2020-07-23 | Discharge: 2020-07-23 | Disposition: A | Discharge status: Home / Self Care | Payer: 59 | Attending: Emergency Medicine | Admitting: Emergency Medicine

## 2020-07-23 DIAGNOSIS — Z760 Encounter for issue of repeat prescription: Secondary | ICD-10-CM

## 2020-07-23 DIAGNOSIS — R21 Rash and other nonspecific skin eruption: Secondary | ICD-10-CM | POA: Diagnosis not present

## 2020-07-23 HISTORY — DX: Chronic pain syndrome: G89.4

## 2020-07-23 MED ORDER — NEURONTIN 800 MG PO TABS: 800.0000 mg | ORAL_TABLET | Freq: Four times a day (QID) | ORAL | 1 refills | Status: AC

## 2020-07-23 MED ORDER — PREDNISONE 10 MG (21) PO TBPK: ORAL_TABLET | Freq: Every day | ORAL | 0 refills | Status: DC

## 2020-07-23 NOTE — Discharge Instructions (Addendum)
Prescribed prednisone.  Psoriatic arthritis vs. Numular eczema Dermatology referral made.  They will follow up with you regarding an appt Gabapentin prescription refilled.   Follow up with PCP Return or go to the ER if you have any new or worsening symptoms such as fever, chills, nausea, vomiting, redness, swelling, discharge, if symptoms do not improve with medications, etc..Marland Kitchen

## 2020-07-23 NOTE — ED Triage Notes (Signed)
Rash to LT arm/ shoulder x 3 weeks.  Denies any itching but is painful. And also possibly a refill on gabapentin 800mg  QID for RSDCRPS.

## 2020-07-23 NOTE — ED Provider Notes (Signed)
[facility]ENTER   [id number] 07/23/20 Arrival Time: 1607  CC: SKIN COMPLAINT  SUBJECTIVE:  Marc Peterson is a 40 y.o. male who presents with a rash x 3 weeks to LT arm and shoulder.  Denies precipitating event or trauma.  Denies changes in soaps, detergents, close contacts with similar rash, known trigger or environmental trigger, allergy. Denies medications change or starting a new medication recently.  Describes it as painful and red.  Has tried OTC medications without relief.  Symptoms are made worse with to the touch.  Denies similar symptoms in the past.  Reports mild joint pain in LT elbow and shoulder as well.   Denies fever, chills, nausea, vomiting, swelling, discharge, oral or genital lesions, SOB, chest pain, abdominal pain, changes in bowel or bladder function.   Also requests gabapentin refill for chronic pain.  Does not have PCP.  Here from [place] to take care of mother with blood cancer.    ROS: As per HPI.  All other pertinent ROS negative.     Past Medical History:  Diagnosis Date   Chronic pain disorder    Neuropathy    Past Surgical History:  Procedure Laterality Date   ELBOW SURGERY     right   HIP SURGERY     x2, right hip   Allergies  Allergen Reactions   Vioxx [Rofecoxib]    No current facility-administered medications on file prior to encounter.   Current Outpatient Medications on File Prior to Encounter  Medication Sig Dispense Refill   fluticasone (FLONASE) 50 MCG/ACT nasal spray Place 1 spray into both nostrils daily for 14 days. 16 g 0   naproxen (NAPROSYN) 500 MG tablet Take 1 tablet (500 mg total) by mouth 2 (two) times daily. 30 tablet 0   Social History   Socioeconomic History   Marital status: Married    Spouse name: Not on file   Number of children: Not on file   Years of education: Not on file   Highest education level: Not on file  Occupational History   Not on file  Tobacco Use   Smoking status: Every Day    Packs/day: 1.00     Pack years: 0.00    Types: Cigarettes   Smokeless tobacco: Never  Substance and Sexual Activity   Alcohol use: Yes    Comment: wine occasionally   Drug use: Never   Sexual activity: Not on file  Other Topics Concern   Not on file  Social History Narrative   Not on file   Social Determinants of Health   Financial Resource Strain: Not on file  Food Insecurity: Not on file  Transportation Needs: Not on file  Physical Activity: Not on file  Stress: Not on file  Social Connections: Not on file  Intimate Partner Violence: Not on file   Family History  Problem Relation Age of Onset   Cancer Mother    Cancer Father     OBJECTIVE: Vitals:   07/23/20 1655  BP: (!) 145/86  Pulse: 95  Resp: 18  Temp: 99.2 F (37.3 C)  TempSrc: Oral  SpO2: 97%    General appearance: alert; no distress Head: NCAT Lungs: normal respiratory effort Extremities: no edema Skin: warm and dry; large irregular erythematous/ pink macular rash to LT arm and shoulder, mildly TTP, no obvious drainage or bleeding, circumference appears scaly and slightly raised, blanches with pressure Psychological: alert and cooperative; normal mood and affect  ASSESSMENT & PLAN:  1. Rash and nonspecific skin  eruption   2. Medication refill     Meds ordered this encounter  Medications   predniSONE (STERAPRED UNI-PAK 21 TAB) 10 MG (21) TBPK tablet    Sig: Take by mouth daily. Take 6 tabs by mouth daily  for 2 days, then 5 tabs for 2 days, then 4 tabs for 2 days, then 3 tabs for 2 days, 2 tabs for 2 days, then 1 tab by mouth daily for 2 days    Dispense:  42 tablet    Refill:  0    Order Specific Question:   Supervising Provider    Answer:   Eustace Moore [[id number]]   NEURONTIN 800 MG tablet    Sig: Take 1 tablet (800 mg total) by mouth in the morning, at noon, in the evening, and at bedtime.    Dispense:  120 tablet    Refill:  1    Order Specific Question:   Supervising Provider    Answer:   Eustace Moore [[id number]]    Prescribed prednisone.  Psoriatic arthritis vs. Numular eczema Dermatology referral made.  They will follow up with you regarding an appt Gabapentin prescription refilled.   Follow up with PCP Return or go to the ER if you have any new or worsening symptoms such as fever, chills, nausea, vomiting, redness, swelling, discharge, if symptoms do not improve with medications, etc...  Reviewed expectations re: course of current medical issues. Questions answered. Outlined signs and symptoms indicating need for more acute intervention. Patient verbalized understanding. After Visit Summary given.    Rennis Harding, PA-C 07/23/20 1737

## 2020-08-01 ENCOUNTER — Emergency Department (HOSPITAL_COMMUNITY): Payer: 59

## 2020-08-01 ENCOUNTER — Encounter (HOSPITAL_COMMUNITY): Payer: Self-pay | Admitting: Emergency Medicine

## 2020-08-01 ENCOUNTER — Emergency Department (HOSPITAL_COMMUNITY)
Admission: EM | Admit: 2020-08-01 | Discharge: 2020-08-01 | Disposition: A | Discharge status: Home / Self Care | Payer: 59 | Attending: Emergency Medicine | Admitting: Emergency Medicine

## 2020-08-01 ENCOUNTER — Other Ambulatory Visit: Payer: Self-pay

## 2020-08-01 DIAGNOSIS — K7689 Other specified diseases of liver: Secondary | ICD-10-CM | POA: Insufficient documentation

## 2020-08-01 DIAGNOSIS — F17210 Nicotine dependence, cigarettes, uncomplicated: Secondary | ICD-10-CM | POA: Diagnosis not present

## 2020-08-01 DIAGNOSIS — R21 Rash and other nonspecific skin eruption: Secondary | ICD-10-CM | POA: Insufficient documentation

## 2020-08-01 DIAGNOSIS — R1030 Lower abdominal pain, unspecified: Secondary | ICD-10-CM

## 2020-08-01 DIAGNOSIS — R1031 Right lower quadrant pain: Secondary | ICD-10-CM | POA: Diagnosis present

## 2020-08-01 DIAGNOSIS — K769 Liver disease, unspecified: Secondary | ICD-10-CM

## 2020-08-01 LAB — URINALYSIS, ROUTINE W REFLEX MICROSCOPIC
Bilirubin Urine: NEGATIVE
Glucose, UA: NEGATIVE mg/dL
Hgb urine dipstick: NEGATIVE
Ketones, ur: NEGATIVE mg/dL
Leukocytes,Ua: NEGATIVE
Nitrite: NEGATIVE
Protein, ur: NEGATIVE mg/dL
Specific Gravity, Urine: >1.046 — ABNORMAL HIGH (ref 1.005–1.030)
pH: 8 (ref 5.0–8.0)

## 2020-08-01 LAB — CBC WITH DIFFERENTIAL/PLATELET
Abs Immature Granulocytes: 0.14 10*3/uL — ABNORMAL HIGH (ref 0.00–0.07)
Basophils Absolute: 0.1 10*3/uL (ref 0.0–0.1)
Basophils Relative: 1 %
Eosinophils Absolute: 0 10*3/uL (ref 0.0–0.5)
Eosinophils Relative: 0 %
HCT: 44.5 % (ref 39.0–52.0)
Hemoglobin: 14.9 g/dL (ref 13.0–17.0)
Immature Granulocytes: 1 %
Lymphocytes Relative: 8 %
Lymphs Abs: 1.1 10*3/uL (ref 0.7–4.0)
MCH: 30.2 pg (ref 26.0–34.0)
MCHC: 33.5 g/dL (ref 30.0–36.0)
MCV: 90.3 fL (ref 80.0–100.0)
Monocytes Absolute: 0.5 10*3/uL (ref 0.1–1.0)
Monocytes Relative: 4 %
Neutro Abs: 11.5 10*3/uL — ABNORMAL HIGH (ref 1.7–7.7)
Neutrophils Relative %: 86 %
Platelets: 218 10*3/uL (ref 150–400)
RBC: 4.93 MIL/uL (ref 4.22–5.81)
RDW: 15.1 % (ref 11.5–15.5)
WBC: 13.4 10*3/uL — ABNORMAL HIGH (ref 4.0–10.5)
nRBC: 0 % (ref 0.0–0.2)

## 2020-08-01 LAB — COMPREHENSIVE METABOLIC PANEL
ALT: 25 U/L (ref 0–44)
AST: 21 U/L (ref 15–41)
Albumin: 4.3 g/dL (ref 3.5–5.0)
Alkaline Phosphatase: 58 U/L (ref 38–126)
Anion gap: 8 (ref 5–15)
BUN: 16 mg/dL (ref 6–20)
CO2: 28 mmol/L (ref 22–32)
Calcium: 9 mg/dL (ref 8.9–10.3)
Chloride: 100 mmol/L (ref 98–111)
Creatinine, Ser: 0.8 mg/dL (ref 0.61–1.24)
GFR, Estimated: >60 mL/min (ref >60)
Glucose, Bld: 115 mg/dL — ABNORMAL HIGH (ref 70–99)
Potassium: 4.1 mmol/L (ref 3.5–5.1)
Sodium: 136 mmol/L (ref 135–145)
Total Bilirubin: 1 mg/dL (ref 0.3–1.2)
Total Protein: 7.4 g/dL (ref 6.5–8.1)

## 2020-08-01 LAB — LIPASE, BLOOD: Lipase: 31 U/L (ref 11–51)

## 2020-08-01 MED ORDER — ONDANSETRON 4 MG PO TBDP: 4.0000 mg | ORAL_TABLET | Freq: Three times a day (TID) | ORAL | 0 refills | Status: AC | PRN

## 2020-08-01 MED ORDER — SODIUM CHLORIDE 0.9 % IV BOLUS
1000.0000 mL | Freq: Once | INTRAVENOUS | Status: AC
Start: 2020-08-01 — End: 2020-08-01
  Administered 2020-08-01: 1000 mL via INTRAVENOUS

## 2020-08-01 MED ORDER — HYDROMORPHONE HCL 1 MG/ML IJ SOLN
1.0000 mg | Freq: Once | INTRAMUSCULAR | Status: AC
  Administered 2020-08-01: 1 mg via INTRAVENOUS
  Filled 2020-08-01: qty 1

## 2020-08-01 MED ORDER — DICYCLOMINE HCL 20 MG PO TABS: 20.0000 mg | ORAL_TABLET | Freq: Three times a day (TID) | ORAL | 0 refills | Status: DC

## 2020-08-01 MED ORDER — DICYCLOMINE HCL 20 MG PO TABS: 20.0000 mg | ORAL_TABLET | Freq: Three times a day (TID) | ORAL | 0 refills | Status: AC

## 2020-08-01 MED ORDER — ONDANSETRON HCL 4 MG/2ML IJ SOLN
4.0000 mg | Freq: Once | INTRAMUSCULAR | Status: AC
  Administered 2020-08-01: 4 mg via INTRAVENOUS
  Filled 2020-08-01: qty 2

## 2020-08-01 MED ORDER — IOHEXOL 300 MG/ML  SOLN
75.0000 mL | Freq: Once | INTRAMUSCULAR | Status: AC | PRN
  Administered 2020-08-01: 75 mL via INTRAVENOUS

## 2020-08-01 MED ORDER — ONDANSETRON 4 MG PO TBDP: 4.0000 mg | ORAL_TABLET | Freq: Three times a day (TID) | ORAL | 0 refills | Status: DC | PRN

## 2020-08-01 NOTE — ED Triage Notes (Signed)
Pt has had a rash to left arm for the past several weeks. Pt seen at [facility] for same and prescribed medications but rash has continued to get worse.  Pt also c/o lower abd pressure that radiates around to his back.

## 2020-08-01 NOTE — Discharge Instructions (Addendum)
If you develop worsening, continued, or recurrent abdominal pain, uncontrolled vomiting, fever, chest or back pain, or any other new/concerning symptoms then return to the ER for evaluation.   Your CT scan showed an abnormal lesion in your liver.  You will need an MRI to further evaluate this.  Follow-up with a primary care physician who can set this up as an outpatient.

## 2020-08-01 NOTE — ED Provider Notes (Signed)
[facility] EMERGENCY DEPARTMENT Provider Note   CSN: [id number] Arrival date & time: 08/01/20  1732     History Chief Complaint  Patient presents with  . Rash  . Abdominal Pain    Marc Peterson is a 40 y.o. male.  HPI 40 year old male presents with severe abdominal pain.  This has been ongoing for about 72 hours.  At first it was intermittent but now constant.  Feels sharp at times and any minimal palpation hurts.  It is mostly in his right lower abdomen.  He is having low back pain as well.  He has some chronic back issues but he is having new back pain with this.  No fevers.  He has been having nausea.  He is also been constipated.  He has been dealing with a rash to his left shoulder and upper arm for 3+ weeks that he states also is not much better but that is not the reason he is here today.  Past Medical History:  Diagnosis Date  . Chronic pain disorder   . Neuropathy     There are no problems to display for this patient.   Past Surgical History:  Procedure Laterality Date  . ELBOW SURGERY     right  . HIP SURGERY     x2, right hip       Family History  Problem Relation Age of Onset  . Cancer Mother   . Cancer Father     Social History   Tobacco Use  . Smoking status: Every Day    Packs/day: 1.00    Types: Cigarettes  . Smokeless tobacco: Never  Substance Use Topics  . Alcohol use: Yes    Comment: wine occasionally  . Drug use: Never    Home Medications Prior to Admission medications   Medication Sig Start Date End Date Taking? Authorizing Provider  NEURONTIN 800 MG tablet Take 1 tablet (800 mg total) by mouth in the morning, at noon, in the evening, and at bedtime. 07/23/20 09/21/20 Yes Wurst, [place], PA-C  predniSONE (STERAPRED UNI-PAK 21 TAB) 10 MG (21) TBPK tablet Take by mouth daily. Take 6 tabs by mouth daily  for 2 days, then 5 tabs for 2 days, then 4 tabs for 2 days, then 3 tabs for 2 days, 2 tabs for 2 days, then 1 tab by mouth daily for 2  days 07/23/20  Yes Wurst, [place], PA-C  dicyclomine (BENTYL) 20 MG tablet Take 1 tablet (20 mg total) by mouth 3 (three) times daily before meals. 08/01/20   Pricilla Loveless, MD  fluticasone (FLONASE) 50 MCG/ACT nasal spray Place 1 spray into both nostrils daily for 14 days. Patient not taking: Reported on 08/01/2020 02/14/20 02/28/20  Durward Parcel, FNP  naproxen (NAPROSYN) 500 MG tablet Take 1 tablet (500 mg total) by mouth 2 (two) times daily. Patient not taking: No sig reported 05/05/20   Tanda Rockers, PA-C  ondansetron (ZOFRAN ODT) 4 MG disintegrating tablet Take 1 tablet (4 mg total) by mouth every 8 (eight) hours as needed for nausea or vomiting. 08/01/20   Pricilla Loveless, MD    Allergies    Nsaids and Rofecoxib  Review of Systems   Review of Systems  Constitutional:  Negative for fever.  Gastrointestinal:  Positive for abdominal pain, constipation and nausea. Negative for vomiting.  Genitourinary:  Negative for dysuria and testicular pain.  Musculoskeletal:  Positive for back pain.  All other systems reviewed and are negative.  Physical Exam Updated Vital Signs BP  134/76   Pulse 86   Temp 99.1 F (37.3 C) (Oral)   Resp 18   Ht 6\' 2"  (1.88 m)   Wt 86.2 kg   SpO2 94%   BMI 24.39 kg/m   Physical Exam Vitals and nursing note reviewed.  Constitutional:      Appearance: He is well-developed. He is not ill-appearing or diaphoretic.  HENT:     Head: Normocephalic and atraumatic.     Right Ear: External ear normal.     Left Ear: External ear normal.     Nose: Nose normal.  Eyes:     General:        Right eye: No discharge.        Left eye: No discharge.  Cardiovascular:     Rate and Rhythm: Normal rate and regular rhythm.     Heart sounds: Normal heart sounds.  Pulmonary:     Effort: Pulmonary effort is normal.     Breath sounds: Normal breath sounds.  Abdominal:     Palpations: Abdomen is soft.     Tenderness: There is abdominal tenderness in the right lower  quadrant.  Genitourinary:    Testes:        Right: Tenderness or swelling not present.        Left: Tenderness or swelling not present.  Musculoskeletal:     Cervical back: Neck supple.  Skin:    General: Skin is warm and dry.     Findings: Rash present.     Comments: Large flat red rash without increased warmth to shoulder and upper arm. Skips a small piece of skin. It is not tender.   Neurological:     Mental Status: He is alert.  Psychiatric:        Mood and Affect: Mood is not anxious.    ED Results / Procedures / Treatments   Labs (all labs ordered are listed, but only abnormal results are displayed) Labs Reviewed  COMPREHENSIVE METABOLIC PANEL - Abnormal; Notable for the following components:      Result Value   Glucose, Bld 115 (*)    All other components within normal limits  URINALYSIS, ROUTINE W REFLEX MICROSCOPIC - Abnormal; Notable for the following components:   Specific Gravity, Urine >1.046 (*)    All other components within normal limits  CBC WITH DIFFERENTIAL/PLATELET - Abnormal; Notable for the following components:   WBC 13.4 (*)    Neutro Abs 11.5 (*)    Abs Immature Granulocytes 0.14 (*)    All other components within normal limits  LIPASE, BLOOD    EKG None  Radiology CT ABDOMEN PELVIS W CONTRAST  Result Date: 08/01/2020 CLINICAL DATA:  Right lower quadrant abdominal pain, lower abdominal pressure radiating to back, left arm rash EXAM: CT ABDOMEN AND PELVIS WITH CONTRAST TECHNIQUE: Multidetector CT imaging of the abdomen and pelvis was performed using the standard protocol following bolus administration of intravenous contrast. CONTRAST:  73mL OMNIPAQUE IOHEXOL 300 MG/ML  SOLN COMPARISON:  05/05/2020 FINDINGS: Lower chest: No acute pleural or parenchymal lung disease. Hepatobiliary: 2.4 cm lobular structure off the inferior aspect right lobe liver reference image 36/2 does not appear significantly different than prior study, most consistent with small  hemangioma. As per previous recommendation, MRI follow-up could be performed for definitive characterization. No other focal liver abnormalities. No gallstones, gallbladder wall thickening, or biliary dilatation. Pancreas: Unremarkable. No pancreatic ductal dilatation or surrounding inflammatory changes. Spleen: Normal in size without focal abnormality. Adrenals/Urinary Tract: The kidneys enhance  normally and symmetrically. No urinary tract calculi or obstructive uropathy. Adrenals are normal. Evaluation of bladder is limited due to under distension and streak artifact from a right hip arthroplasty. Stomach/Bowel: No bowel obstruction or ileus. Normal gas-filled appendix right lower quadrant. No bowel wall thickening or inflammatory change. Vascular/Lymphatic: Aortic atherosclerosis. No enlarged abdominal or pelvic lymph nodes. Reproductive: Prostate is unremarkable. Other: No free fluid or free gas.  No abdominal wall hernia. Musculoskeletal: No acute or destructive bony lesions. Reconstructed images demonstrate no additional findings. IMPRESSION: 1. No acute intra-abdominal or intrapelvic process. Normal appendix. 2. 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 Nonemergent outpatient follow-up dedicated liver MRI may be useful for definitive characterization if not previously performed. 3.  Aortic Atherosclerosis (ICD10-I70.0). Electronically Signed   By: Sharlet Salina M.D.   On: 08/01/2020 20:18    Procedures Procedures   Medications Ordered in ED Medications  sodium chloride 0.9 % bolus 1,000 mL (0 mLs Intravenous Stopped 08/01/20 2043)  HYDROmorphone (DILAUDID) injection 1 mg (1 mg Intravenous Given 08/01/20 1859)  ondansetron (ZOFRAN) injection 4 mg (4 mg Intravenous Given 08/01/20 1858)  iohexol (OMNIPAQUE) 300 MG/ML solution 75 mL (75 mLs Intravenous Contrast Given 08/01/20 1952)    ED Course  I have reviewed the triage vital signs and the nursing notes.  Pertinent labs &  imaging results that were available during my care of the patient were reviewed by me and considered in my medical decision making (see chart for details).    MDM Rules/Calculators/A&P                          Given the right lower quadrant tenderness, CT was obtained.  No obvious findings.  Does have a nonspecific liver lesion that was seen on his CT a few months ago and is probably hemangioma.  We discussed he will need outpatient MRI.  Labs are otherwise unremarkable besides some leukocytosis that is nonspecific.  No testicular pain or tenderness on exam.  At this point, I feel he is ready for discharge home and can return if symptoms worsen but otherwise needs to follow-up with a PCP. Final Clinical Impression(s) / ED Diagnoses Final diagnoses:  Lower abdominal pain  Liver lesion    Rx / DC Orders ED Discharge Orders          Ordered    dicyclomine (BENTYL) 20 MG tablet  3 times daily before meals,   Status:  Discontinued        08/01/20 2110    ondansetron (ZOFRAN ODT) 4 MG disintegrating tablet  Every 8 hours PRN,   Status:  Discontinued        08/01/20 2110    dicyclomine (BENTYL) 20 MG tablet  3 times daily before meals        08/01/20 2111    ondansetron (ZOFRAN ODT) 4 MG disintegrating tablet  Every 8 hours PRN        08/01/20 2111             Pricilla Loveless, MD 08/01/20 2123

## 2020-09-01 ENCOUNTER — Encounter: Payer: Self-pay | Admitting: Internal Medicine

## 2020-12-03 ENCOUNTER — Telehealth: Payer: Self-pay | Admitting: Family Medicine

## 2020-12-03 NOTE — Telephone Encounter (Signed)
Called patient to schedule Dermatology appointment, LVM if patient calls back please assist in scheduling appointment.   Appointment Notes: Ref by RUC (Rash).  Thanks!

## 2020-12-30 ENCOUNTER — Encounter: Payer: Self-pay | Admitting: Internal Medicine

## 2020-12-30 ENCOUNTER — Ambulatory Visit: Payer: PRIVATE HEALTH INSURANCE | Admitting: Gastroenterology

## 2021-02-23 ENCOUNTER — Telehealth: Payer: Self-pay | Admitting: Family Medicine

## 2021-02-23 NOTE — Telephone Encounter (Signed)
Called patient to schedule dermatology appointment, patient wasn't able to schedule at the moment but is going to call back to schedule.   Appointment Notes: Ref by RUC (Rash).

## 2021-08-25 DIAGNOSIS — M26622 Arthralgia of left temporomandibular joint: Secondary | ICD-10-CM | POA: Insufficient documentation

## 2021-10-06 ENCOUNTER — Other Ambulatory Visit (HOSPITAL_COMMUNITY): Payer: Self-pay | Admitting: Family Medicine

## 2021-10-06 DIAGNOSIS — M810 Age-related osteoporosis without current pathological fracture: Secondary | ICD-10-CM

## 2021-11-01 ENCOUNTER — Other Ambulatory Visit (HOSPITAL_COMMUNITY): Payer: PRIVATE HEALTH INSURANCE

## 2021-11-02 ENCOUNTER — Other Ambulatory Visit (HOSPITAL_COMMUNITY): Payer: PRIVATE HEALTH INSURANCE

## 2021-11-08 ENCOUNTER — Ambulatory Visit (HOSPITAL_COMMUNITY)
Admission: RE | Admit: 2021-11-08 | Discharge: 2021-11-08 | Disposition: A | Discharge status: Home / Self Care | Payer: Medicare Other | Source: Ambulatory Visit | Attending: Family Medicine | Admitting: Family Medicine

## 2021-11-08 DIAGNOSIS — M810 Age-related osteoporosis without current pathological fracture: Secondary | ICD-10-CM | POA: Insufficient documentation

## 2021-12-11 ENCOUNTER — Encounter (HOSPITAL_COMMUNITY): Payer: Self-pay | Admitting: Emergency Medicine

## 2021-12-11 ENCOUNTER — Emergency Department (HOSPITAL_COMMUNITY): Payer: Medicare Other

## 2021-12-11 ENCOUNTER — Emergency Department (HOSPITAL_COMMUNITY)
Admission: EM | Admit: 2021-12-11 | Discharge: 2021-12-11 | Disposition: A | Discharge status: Home / Self Care | Payer: Medicare Other | Attending: Emergency Medicine | Admitting: Emergency Medicine

## 2021-12-11 ENCOUNTER — Other Ambulatory Visit: Payer: Self-pay

## 2021-12-11 DIAGNOSIS — R109 Unspecified abdominal pain: Secondary | ICD-10-CM | POA: Diagnosis present

## 2021-12-11 DIAGNOSIS — N201 Calculus of ureter: Secondary | ICD-10-CM

## 2021-12-11 LAB — BASIC METABOLIC PANEL
Anion gap: 8 (ref 5–15)
BUN: 13 mg/dL (ref 6–20)
CO2: 27 mmol/L (ref 22–32)
Calcium: 9.2 mg/dL (ref 8.9–10.3)
Chloride: 106 mmol/L (ref 98–111)
Creatinine, Ser: 0.9 mg/dL (ref 0.61–1.24)
GFR, Estimated: >60 mL/min (ref >60)
Glucose, Bld: 105 mg/dL — ABNORMAL HIGH (ref 70–99)
Potassium: 4.6 mmol/L (ref 3.5–5.1)
Sodium: 141 mmol/L (ref 135–145)

## 2021-12-11 LAB — CBC WITH DIFFERENTIAL/PLATELET
Abs Immature Granulocytes: 0.04 10*3/uL (ref 0.00–0.07)
Basophils Absolute: 0.1 10*3/uL (ref 0.0–0.1)
Basophils Relative: 1 %
Eosinophils Absolute: 0.1 10*3/uL (ref 0.0–0.5)
Eosinophils Relative: 1 %
HCT: 46.4 % (ref 39.0–52.0)
Hemoglobin: 15.4 g/dL (ref 13.0–17.0)
Immature Granulocytes: 0 %
Lymphocytes Relative: 18 %
Lymphs Abs: 1.6 10*3/uL (ref 0.7–4.0)
MCH: 28.9 pg (ref 26.0–34.0)
MCHC: 33.2 g/dL (ref 30.0–36.0)
MCV: 87.1 fL (ref 80.0–100.0)
Monocytes Absolute: 0.4 10*3/uL (ref 0.1–1.0)
Monocytes Relative: 5 %
Neutro Abs: 6.8 10*3/uL (ref 1.7–7.7)
Neutrophils Relative %: 75 %
Platelets: 233 10*3/uL (ref 150–400)
RBC: 5.33 MIL/uL (ref 4.22–5.81)
RDW: 14.4 % (ref 11.5–15.5)
WBC: 9.1 10*3/uL (ref 4.0–10.5)
nRBC: 0 % (ref 0.0–0.2)

## 2021-12-11 LAB — URINALYSIS, ROUTINE W REFLEX MICROSCOPIC
Bilirubin Urine: NEGATIVE
Glucose, UA: NEGATIVE mg/dL
Ketones, ur: 5 mg/dL — AB
Leukocytes,Ua: NEGATIVE
Nitrite: NEGATIVE
Protein, ur: 30 mg/dL — AB
Specific Gravity, Urine: 1.017 (ref 1.005–1.030)
pH: 5 (ref 5.0–8.0)

## 2021-12-11 MED ORDER — TAMSULOSIN HCL 0.4 MG PO CAPS: 0.4000 mg | ORAL_CAPSULE | Freq: Every day | ORAL | 0 refills | Status: AC

## 2021-12-11 MED ORDER — MORPHINE SULFATE (PF) 4 MG/ML IV SOLN
6.0000 mg | Freq: Once | INTRAVENOUS | Status: AC
  Administered 2021-12-11: 6 mg via INTRAVENOUS
  Filled 2021-12-11: qty 2

## 2021-12-11 MED ORDER — TAMSULOSIN HCL 0.4 MG PO CAPS
0.4000 mg | ORAL_CAPSULE | Freq: Once | ORAL | Status: AC
  Administered 2021-12-11: 0.4 mg via ORAL
  Filled 2021-12-11: qty 1

## 2021-12-11 MED ORDER — KETOROLAC TROMETHAMINE 30 MG/ML IJ SOLN
15.0000 mg | Freq: Once | INTRAMUSCULAR | Status: AC
  Administered 2021-12-11: 15 mg via INTRAVENOUS
  Filled 2021-12-11: qty 1

## 2021-12-11 MED ORDER — ONDANSETRON HCL 4 MG/2ML IJ SOLN
4.0000 mg | Freq: Once | INTRAMUSCULAR | Status: AC
  Administered 2021-12-11: 4 mg via INTRAVENOUS
  Filled 2021-12-11: qty 2

## 2021-12-11 MED ORDER — OXYCODONE-ACETAMINOPHEN 5-325 MG PO TABS: 1.0000 | ORAL_TABLET | ORAL | 0 refills | Status: DC | PRN

## 2021-12-11 MED ORDER — HYDROMORPHONE HCL 1 MG/ML IJ SOLN
1.0000 mg | Freq: Once | INTRAMUSCULAR | Status: AC
  Administered 2021-12-11: 1 mg via INTRAVENOUS
  Filled 2021-12-11: qty 1

## 2021-12-11 NOTE — ED Provider Notes (Signed)
[facility] EMERGENCY DEPARTMENT Provider Note   CSN: [id number] Arrival date & time: 12/11/21  1242     History  Chief Complaint  Patient presents with   Flank Pain    Marc Peterson. is a 41 y.o. male.  The history is provided by the patient.  Flank Pain This is a new problem. The current episode started 1 to 2 hours ago. The problem occurs constantly. The problem has been gradually worsening. Pertinent negatives include no chest pain, no abdominal pain, no headaches and no shortness of breath. Associated symptoms comments: Radiates into umbilical region.  He denies fevers or chills, vomiting but has been nauseated.  No dysuria or penile discharge.  He does not have a history of kidney stones.. Exacerbated by: lying flat, better but not resolved when sitting with hips flexed. Nothing relieves the symptoms. He has tried nothing for the symptoms.       Home Medications Prior to Admission medications   Medication Sig Start Date End Date Taking? Authorizing Provider  albuterol (VENTOLIN HFA) 108 (90 Base) MCG/ACT inhaler Inhale 1 puff into the lungs every 4 (four) hours as needed for wheezing or shortness of breath.   Yes [provider]  atorvastatin (LIPITOR) 20 MG tablet Take 20 mg by mouth daily. 09/21/21  Yes [provider]  baclofen (LIORESAL) 10 MG tablet Take 10 mg by mouth daily.   Yes [provider]  dicyclomine (BENTYL) 20 MG tablet Take 1 tablet (20 mg total) by mouth 3 (three) times daily before meals. 08/01/20  Yes Pricilla Loveless, MD  losartan (COZAAR) 50 MG tablet Take 50 mg by mouth daily. 09/11/21  Yes [provider]  NEURONTIN 800 MG tablet Take 1 tablet (800 mg total) by mouth in the morning, at noon, in the evening, and at bedtime. 07/23/20  Yes Wurst, [place], PA-C  ondansetron (ZOFRAN ODT) 4 MG disintegrating tablet Take 1 tablet (4 mg total) by mouth every 8 (eight) hours as needed for nausea or vomiting. 08/01/20  Yes  Pricilla Loveless, MD  oxyCODONE-acetaminophen (PERCOCET/ROXICET) 5-325 MG tablet Take 1 tablet by mouth every 4 (four) hours as needed. 12/11/21  Yes Madgie Dhaliwal, Raynelle Fanning, PA-C  tamsulosin (FLOMAX) 0.4 MG CAPS capsule Take 1 capsule (0.4 mg total) by mouth daily after supper. 12/11/21  Yes Dionta Larke, Raynelle Fanning, PA-C  traMADol (ULTRAM) 50 MG tablet Take 50-100 mg by mouth every 6 (six) hours as needed for moderate pain or severe pain.   Yes [provider]      Allergies    Nsaids and Rofecoxib    Review of Systems   Review of Systems  Constitutional:  Negative for chills and fever.  HENT:  Negative for congestion and sore throat.   Eyes: Negative.   Respiratory:  Negative for chest tightness and shortness of breath.   Cardiovascular:  Negative for chest pain.  Gastrointestinal:  Positive for nausea. Negative for abdominal pain and vomiting.  Genitourinary:  Positive for flank pain.  Musculoskeletal:  Negative for arthralgias, joint swelling and neck pain.  Skin: Negative.  Negative for rash and wound.  Neurological:  Negative for dizziness, weakness, light-headedness, numbness and headaches.  Psychiatric/Behavioral: Negative.    All other systems reviewed and are negative.   Physical Exam Updated Vital Signs BP (!) 158/98 (BP Location: Right Arm)   Pulse 62   Temp 97.9 F (36.6 C) (Oral)   Resp 20   Ht 6\' 2"  (1.88 m)   Wt 86.2 kg   SpO2  98%   BMI 24.39 kg/m  Physical Exam Vitals and nursing note reviewed.  Constitutional:      Appearance: He is well-developed.  HENT:     Head: Normocephalic and atraumatic.  Eyes:     Conjunctiva/sclera: Conjunctivae normal.  Cardiovascular:     Rate and Rhythm: Normal rate and regular rhythm.     Heart sounds: Normal heart sounds.  Pulmonary:     Effort: Pulmonary effort is normal.     Breath sounds: Normal breath sounds. No wheezing.  Abdominal:     General: Bowel sounds are normal.     Palpations: Abdomen is soft.     Tenderness: There  is no abdominal tenderness. There is left CVA tenderness.  Musculoskeletal:        General: Normal range of motion.     Cervical back: Normal range of motion.  Skin:    General: Skin is warm and dry.  Neurological:     Mental Status: He is alert.     ED Results / Procedures / Treatments   Labs (all labs ordered are listed, but only abnormal results are displayed) Labs Reviewed  BASIC METABOLIC PANEL - Abnormal; Notable for the following components:      Result Value   Glucose, Bld 105 (*)    All other components within normal limits  URINALYSIS, ROUTINE W REFLEX MICROSCOPIC - Abnormal; Notable for the following components:   Hgb urine dipstick MODERATE (*)    Ketones, ur 5 (*)    Protein, ur 30 (*)    Bacteria, UA FEW (*)    All other components within normal limits  URINE CULTURE  CBC WITH DIFFERENTIAL/PLATELET    EKG None  Radiology No results found. CLINICAL DATA: Abdominal/flank pain, stone suspected  EXAM: CT ABDOMEN AND PELVIS WITHOUT CONTRAST  TECHNIQUE: Multidetector CT imaging of the abdomen and pelvis was performed following the standard protocol without IV contrast.  RADIATION DOSE REDUCTION: This exam was performed according to the departmental dose-optimization program which includes automated exposure control, adjustment of the mA and/or kV according to patient size and/or use of iterative reconstruction technique.  COMPARISON: 08/01/2020  FINDINGS: Lower chest: No acute abnormality.  Hepatobiliary: No focal liver abnormality is seen. No gallstones, gallbladder wall thickening, or biliary dilatation.  Pancreas: Unremarkable. No pancreatic ductal dilatation or surrounding inflammatory changes.  Spleen: Normal in size without focal abnormality.  Adrenals/Urinary Tract: Adrenal glands are unremarkable. There is mild left-sided hydronephrosis and hydroureter with a possible 2 mm stone in the distal left ureter. On the right there is  no nephrolithiasis or hydronephrosis identified.  Stomach/Bowel: Stomach is within normal limits. Appendix appears normal. No evidence of bowel wall thickening, distention, or inflammatory changes.  Vascular/Lymphatic: No significant vascular findings are present. No enlarged abdominal or pelvic lymph nodes.  Reproductive:Prostate and seminal vesicles unremarkable.  Other: No abdominal wall hernia or abnormality. No abdominopelvic ascites.  Musculoskeletal: No acute or significant osseous findings.  IMPRESSION: Tiny left distal ureteral stone with mild hydronephrosis.   Electronically Signed By: Layla Maw M.D. On: 12/11/2021 15:03  Procedures Procedures    Medications Ordered in ED Medications  ketorolac (TORADOL) 30 MG/ML injection 15 mg (has no administration in time range)  HYDROmorphone (DILAUDID) injection 1 mg (1 mg Intravenous Given 12/11/21 1505)  ondansetron (ZOFRAN) injection 4 mg (4 mg Intravenous Given 12/11/21 1503)  morphine (PF) 4 MG/ML injection 6 mg (6 mg Intravenous Given 12/11/21 1626)  tamsulosin (FLOMAX) capsule 0.4 mg (0.4 mg Oral Given 12/11/21  1626)    ED Course/ Medical Decision Making/ A&P                           Medical Decision Making Pt presenting with sudden onset of left flank pain without dysuria, fevers or chills, differential diagnosis including kidney stone, pyelonephritis, UTI, musculoskeletal.  Amount and/or Complexity of Data Reviewed Labs: ordered.    Details: Bmet and CBC are normal.  His urinalysis shows a moderate amount of hemoglobin.  He does have a few bacteria, no leukocytosis.  He has no dysuria, will defer treating this painless bacteriuria with antibiotics at this time, but culture has been ordered. Radiology: ordered.    Details: CT renal confirming 2 mm left distal ureteral stone.  Mild hydronephrosis.  Risk Prescription drug management.           Final Clinical Impression(s) / ED Diagnoses Final  diagnoses:  Ureterolithiasis    Rx / DC Orders ED Discharge Orders          Ordered    oxyCODONE-acetaminophen (PERCOCET/ROXICET) 5-325 MG tablet  Every 4 hours PRN        12/11/21 1714    tamsulosin (FLOMAX) 0.4 MG CAPS capsule  Daily after supper        12/11/21 1714              Burgess Amor, PA-C 12/11/21 1719    Gloris Manchester, MD 12/12/21 [phone]

## 2021-12-11 NOTE — Discharge Instructions (Addendum)
Make sure you are drinking plenty of fluids.  You have been prescribed additional pain medicine if needed if your pain returns.  Do not drive within 4 hours of taking this medicine as that will make you drowsy you have also been prescribed a medication called Flomax.  This will help to dilate the tube your stone is sitting in and may help with pass easier and quicker.  You have received today's dose of this, take your next dose tomorrow evening.  Call Dr. Benancio Deeds our urologist for further evaluation and management of your kidney stone.  Make sure you are straining your urine so you will know if this stone passes.  Return here for recheck if you develop worse pain uncontrolled vomiting or fever

## 2021-12-11 NOTE — ED Triage Notes (Addendum)
Patient c/o severe left side flank pain that radiates into umbilical region. Per patient started x2 hours ago and is progressively getting worse. Per patient diaphoresis and nausea. Denies any hx of kidney stones. Denies any vomiting, diarrhea, urinary symptoms, or fevers. Last BM this morning-normal-no blood noted.

## 2021-12-13 LAB — URINE CULTURE: Culture: NO GROWTH

## 2021-12-27 ENCOUNTER — Other Ambulatory Visit (HOSPITAL_COMMUNITY): Payer: Self-pay | Admitting: Family Medicine

## 2021-12-27 DIAGNOSIS — R600 Localized edema: Secondary | ICD-10-CM

## 2021-12-30 ENCOUNTER — Ambulatory Visit (HOSPITAL_COMMUNITY): Payer: Medicare Other

## 2022-01-03 ENCOUNTER — Ambulatory Visit (HOSPITAL_COMMUNITY)
Admission: RE | Admit: 2022-01-03 | Discharge: 2022-01-03 | Disposition: A | Discharge status: Home / Self Care | Payer: Medicare Other | Source: Ambulatory Visit | Attending: Family Medicine | Admitting: Family Medicine

## 2022-01-03 DIAGNOSIS — R600 Localized edema: Secondary | ICD-10-CM | POA: Insufficient documentation

## 2022-03-03 IMAGING — CT CT ABD-PELV W/ CM
2 of 4 series · 13 of 36 positions shown, 18 images · IV contrast (omnipaque)
Comparison: 05/05/2020

CLINICAL DATA: Right lower quadrant abdominal pain, lower abdominal
pressure radiating to back, left arm rash

EXAM:
CT ABDOMEN AND PELVIS WITH CONTRAST
TECHNIQUE: Multidetector CT imaging of the abdomen and pelvis was performed
using the standard protocol following bolus administration of
intravenous contrast.
CONTRAST:  75mL OMNIPAQUE IOHEXOL 300 MG/ML  SOLN

[Series 2: axial st · axial · 0.95mm/px · z∈[+1022,+1442]mm · 12 of 96 slices shown, 16 images]
[im 6/96  soft-tissue]
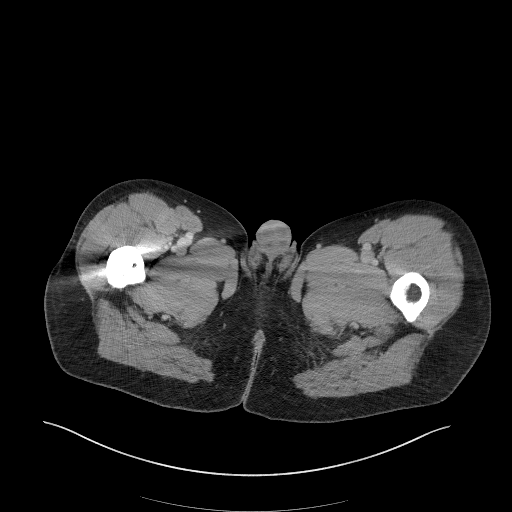
[im 6/96  bone]
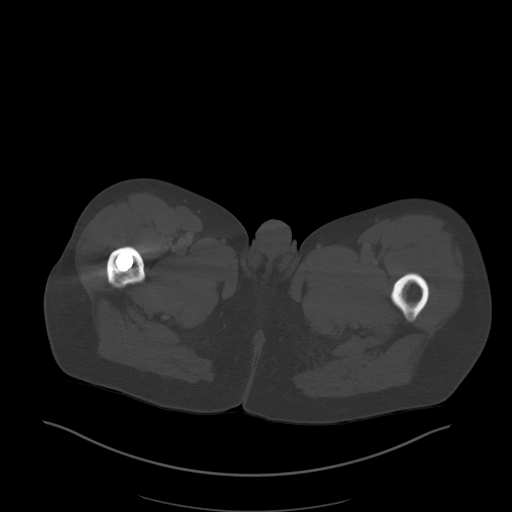
[im 16/96  soft-tissue]
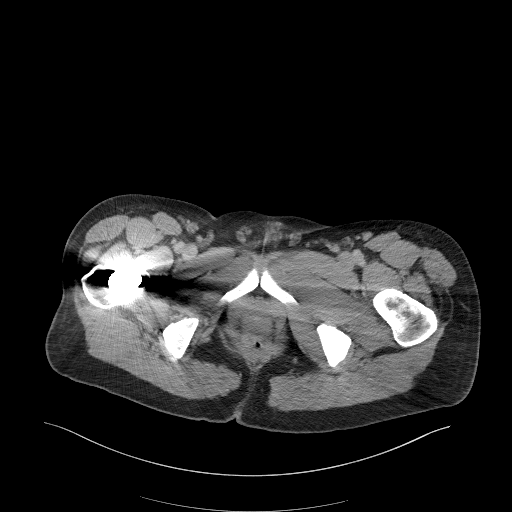
[im 27/96  soft-tissue]
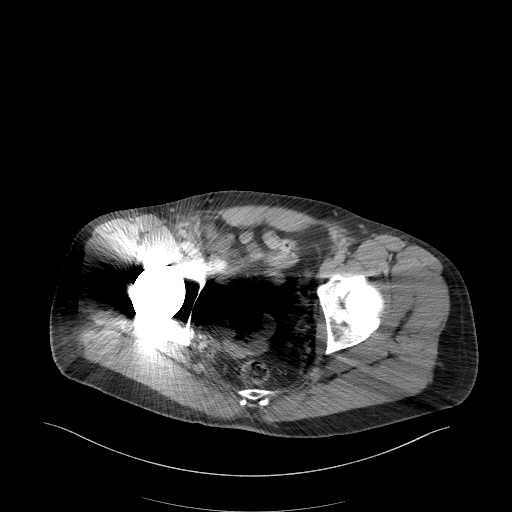
[im 32/96  soft-tissue]
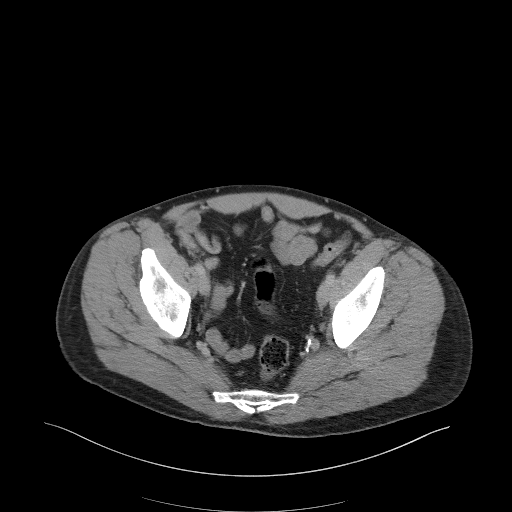
[im 43/96  soft-tissue]
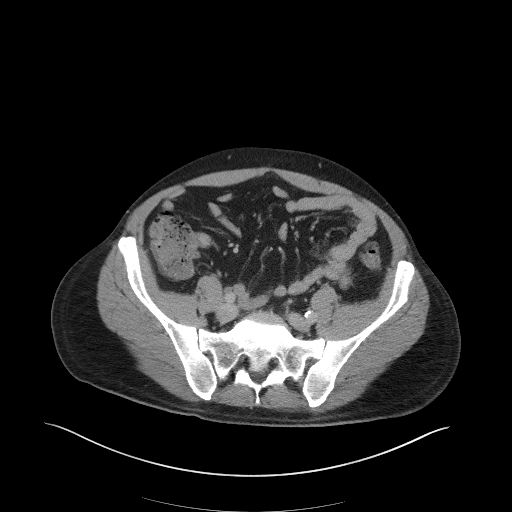
[im 53/96  soft-tissue]
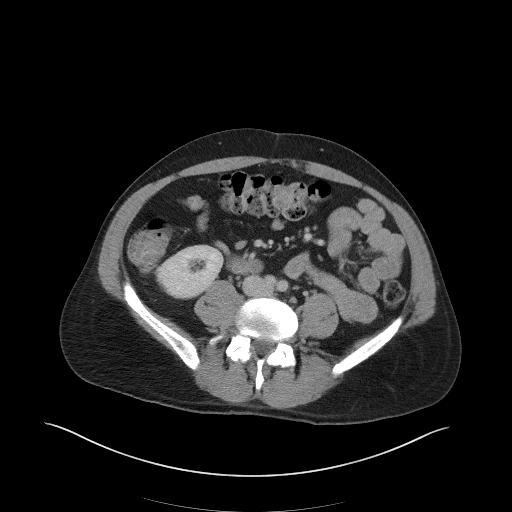
[im 64/96  soft-tissue]
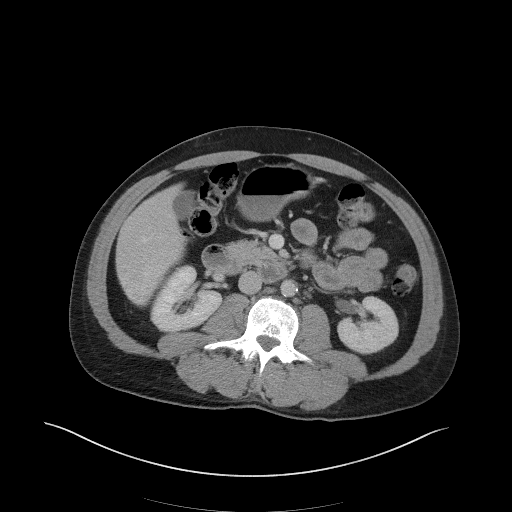
[im 69/96  soft-tissue]
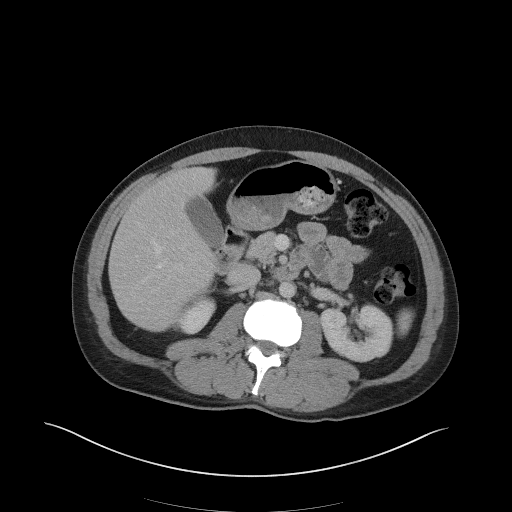
[im 74/96  lung]
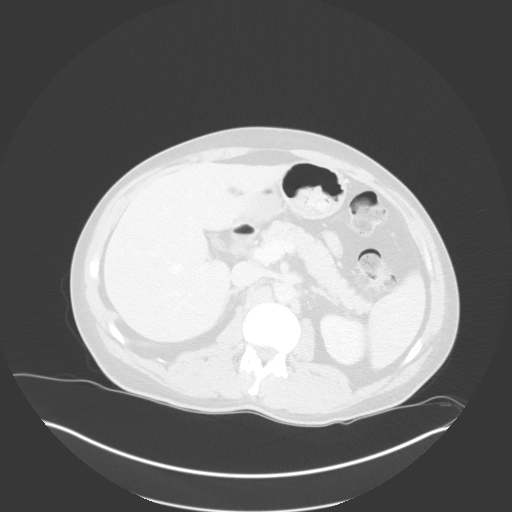
[im 80/96  soft-tissue]
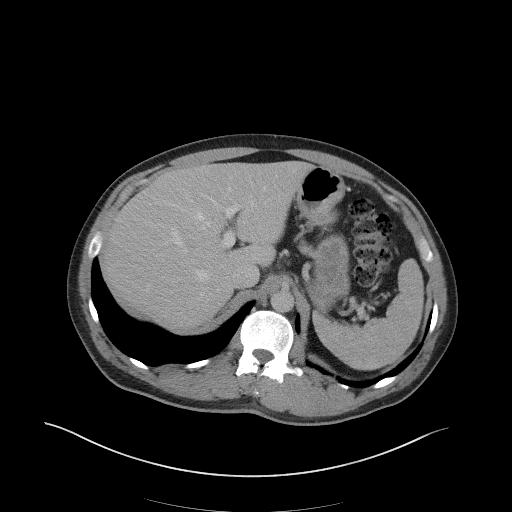
[im 80/96  lung]
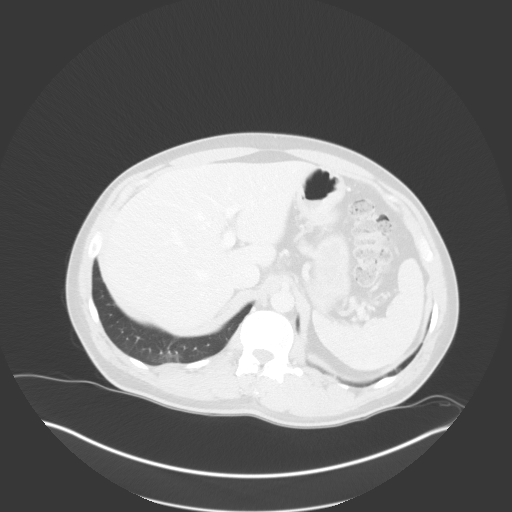
[im 80/96  bone]
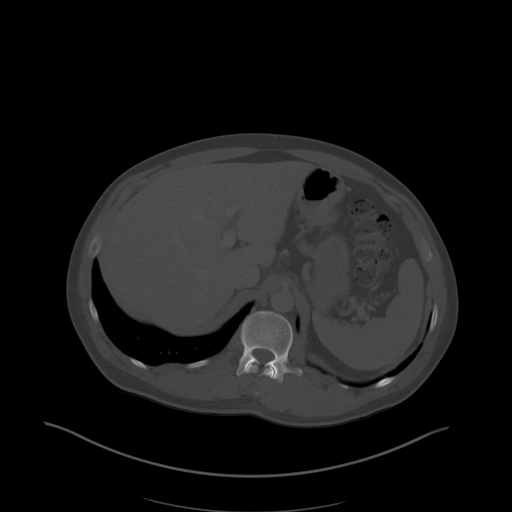
[im 85/96  lung]
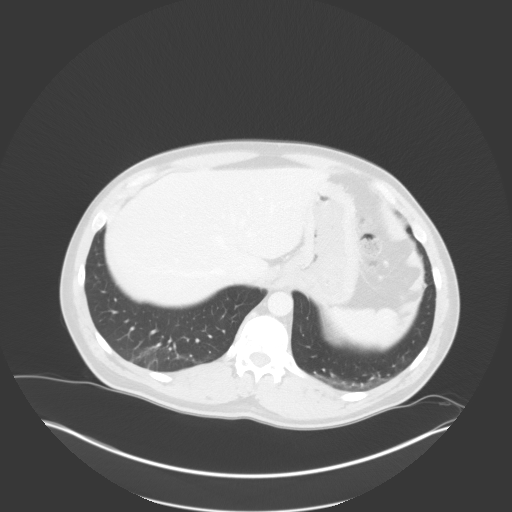
[im 90/96  soft-tissue]
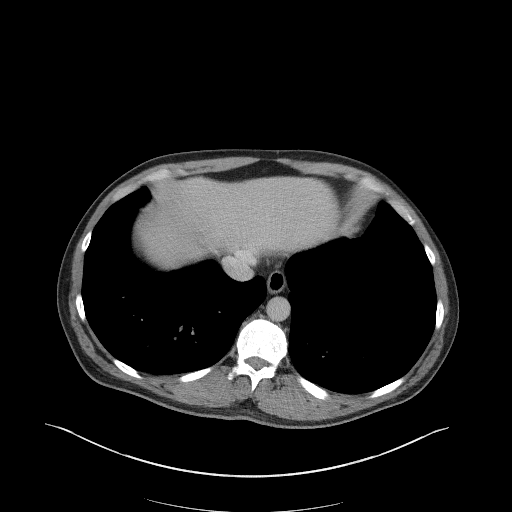
[im 90/96  lung]
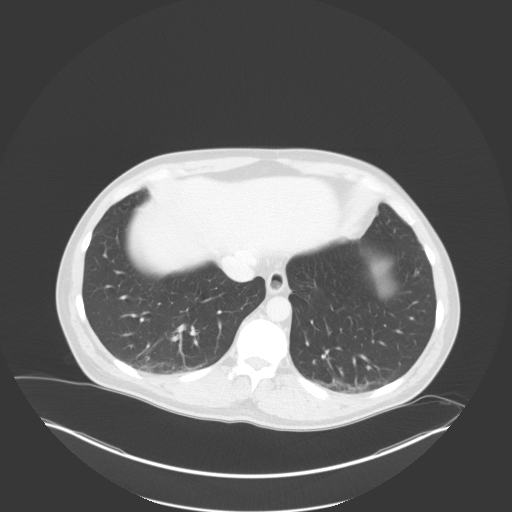

[Series 6: sagittal st · sagittal · 0.65mm/px · 1 of 140 slices shown, 2 images]
[im 47/140  soft-tissue]
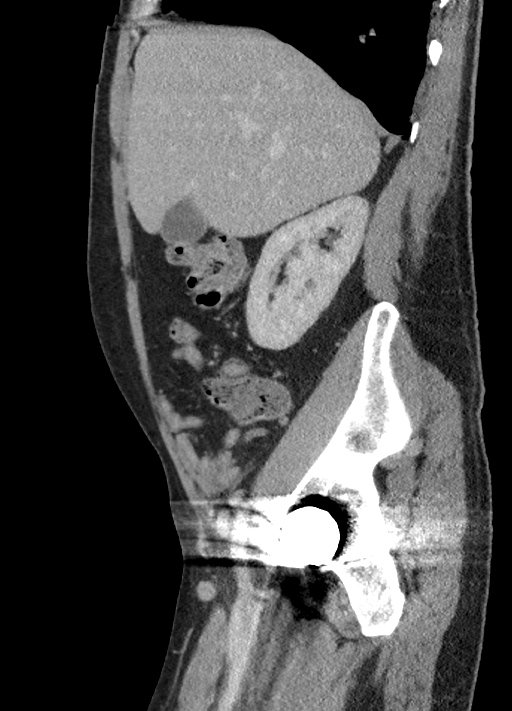
[im 47/140  bone]
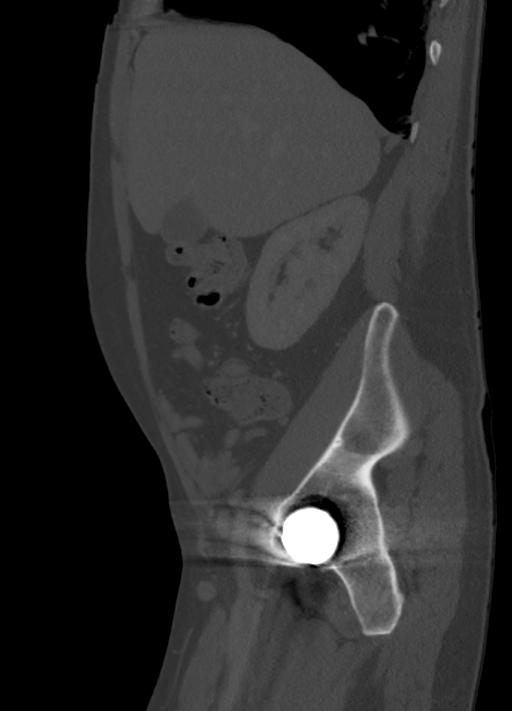

[13 of 36 positions shown; findings below may reference images not displayed]

FINDINGS: Lower chest: No acute pleural or parenchymal lung disease.

Hepatobiliary: 2.4 cm lobular structure off the inferior aspect
right lobe liver reference image 36/2 does not appear significantly
different than prior study, most consistent with small hemangioma.
As per previous recommendation, MRI follow-up could be performed for
definitive characterization. No other focal liver abnormalities. No
gallstones, gallbladder wall thickening, or biliary dilatation.

Pancreas: Unremarkable. No pancreatic ductal dilatation or
surrounding inflammatory changes.

Spleen: Normal in size without focal abnormality.

Adrenals/Urinary Tract: The kidneys enhance normally and
symmetrically. No urinary tract calculi or obstructive uropathy.
Adrenals are normal.

Evaluation of bladder is limited due to under distension and streak
artifact from a right hip arthroplasty.

Stomach/Bowel: No bowel obstruction or ileus. Normal gas-filled
appendix right lower quadrant. No bowel wall thickening or
inflammatory change.

Vascular/Lymphatic: Aortic atherosclerosis. No enlarged abdominal or
pelvic lymph nodes.

Reproductive: Prostate is unremarkable.

Other: No free fluid or free gas.  No abdominal wall hernia.

Musculoskeletal: No acute or destructive bony lesions. Reconstructed
images demonstrate no additional findings.
IMPRESSION: 1. No acute intra-abdominal or intrapelvic process. Normal appendix.
2. Stable probable hemangioma off the inferior aspect right lobe
liver. Nonemergent outpatient follow-up dedicated liver MRI may be
useful for definitive characterization if not previously performed.
3.  Aortic Atherosclerosis ([id number]).

## 2024-02-13 ENCOUNTER — Emergency Department (HOSPITAL_COMMUNITY)
Admission: EM | Admit: 2024-02-13 | Discharge: 2024-02-13 | Disposition: A | Discharge status: Home / Self Care | Attending: Emergency Medicine | Admitting: Emergency Medicine

## 2024-02-13 ENCOUNTER — Encounter (HOSPITAL_COMMUNITY): Payer: Self-pay | Admitting: *Deleted

## 2024-02-13 ENCOUNTER — Other Ambulatory Visit: Payer: Self-pay

## 2024-02-13 ENCOUNTER — Emergency Department (HOSPITAL_COMMUNITY)

## 2024-02-13 DIAGNOSIS — M25561 Pain in right knee: Secondary | ICD-10-CM | POA: Insufficient documentation

## 2024-02-13 MED ORDER — HYDROCODONE-ACETAMINOPHEN 5-325 MG PO TABS: ORAL_TABLET | ORAL | 0 refills | Status: AC

## 2024-02-13 NOTE — ED Triage Notes (Addendum)
 Pt with right knee pain, fell couple days ago after his leg gave out

## 2024-02-13 NOTE — Discharge Instructions (Signed)
 You may wear the knee brace as needed for support when standing or walking.  Do not wear continuously.  Apply ice packs on and off to help with discomfort.  Please call the orthopedic provider listed to arrange follow-up appointment.

## 2024-02-13 NOTE — ED Notes (Signed)
 Pt/family received d/c paperwork at this time. After going over the paperwork any questions, comments, or concerns were answered to the best of this nurse's knowledge. The pt/family verbally acknowledged the teachings/instructions.

## 2024-02-16 NOTE — ED Provider Notes (Signed)
 " [place] EMERGENCY DEPARTMENT AT [facility] Provider Note   CSN: [id number] Arrival date & time: 02/13/24  1612     Patient presents with: Knee Injury   Marc Mclees. is a 44 y.o. male.   HPI     Marc Peterson. is a 44 y.o. male who presents to the Emergency Department complaining of right knee pain.  States he fell several days ago.  He states his knee has been hurting since his fall.  Pain is mostly to the lateral knee.  He feels at times that his knee is going to give out.  Denies any swelling or redness fever or chills.  No numbness of his extremities.  Prior to Admission medications  Medication Sig Start Date End Date Taking? Authorizing Provider  HYDROcodone -acetaminophen  (NORCO/VICODIN) 5-325 MG tablet Take one tab po q 4 hrs prn pain 02/13/24  Yes Bessie Boyte, PA-C  albuterol (VENTOLIN HFA) 108 (90 Base) MCG/ACT inhaler Inhale 1 puff into the lungs every 4 (four) hours as needed for wheezing or shortness of breath.    [provider]  atorvastatin (LIPITOR) 20 MG tablet Take 20 mg by mouth daily. 09/21/21   [provider]  baclofen (LIORESAL) 10 MG tablet Take 10 mg by mouth daily.    [provider]  dicyclomine  (BENTYL ) 20 MG tablet Take 1 tablet (20 mg total) by mouth 3 (three) times daily before meals. 08/01/20   Freddi Hamilton, MD  losartan (COZAAR) 50 MG tablet Take 50 mg by mouth daily. 09/11/21   [provider]  NEURONTIN  800 MG tablet Take 1 tablet (800 mg total) by mouth in the morning, at noon, in the evening, and at bedtime. 07/23/20   Wurst, Brittany, PA-C  ondansetron  (ZOFRAN  ODT) 4 MG disintegrating tablet Take 1 tablet (4 mg total) by mouth every 8 (eight) hours as needed for nausea or vomiting. 08/01/20   Freddi Hamilton, MD  tamsulosin  (FLOMAX ) 0.4 MG CAPS capsule Take 1 capsule (0.4 mg total) by mouth daily after supper. 12/11/21   Idol, Julie, PA-C    Allergies: Nsaids, Rofecoxib, and Other     Review of Systems  Musculoskeletal:  Positive for arthralgias.  All other systems reviewed and are negative.   Updated Vital Signs BP 122/79 (BP Location: Right Arm)   Pulse 90   Temp 99.3 F (37.4 C) (Oral)   Resp 18   Ht 6' 2 (1.88 m)   Wt 88.9 kg   SpO2 96%   BMI 25.16 kg/m   Physical Exam Vitals and nursing note reviewed.  Constitutional:      General: He is not in acute distress.    Appearance: Normal appearance. He is not toxic-appearing.  Cardiovascular:     Rate and Rhythm: Normal rate and regular rhythm.     Pulses: Normal pulses.  Pulmonary:     Effort: Pulmonary effort is normal.  Musculoskeletal:        General: Tenderness and signs of injury present.     Right knee: No crepitus. Normal range of motion. Tenderness present over the lateral joint line. No patellar tendon tenderness. No LCL laxity or MCL laxity. Normal pulse.     Instability Tests: Anterior drawer test negative. Posterior drawer test negative.     Right lower leg: No edema.     Left lower leg: No edema.  Neurological:     Mental Status: He is alert.     (all labs ordered are listed, but only abnormal  results are displayed) Labs Reviewed - No data to display  EKG: None  Radiology: No results found.   Procedures   Medications Ordered in the ED - No data to display                                  Medical Decision Making   Patient here for evaluation of right knee pain after mechanical fall several days ago.  He is having pain to the lateral knee mostly with weightbearing.  No edema.  He feels at times that his knee is going to buckle or give out.  Denies any numbness or weakness of his extremities.  I suspect this may be meniscal injury, ligamentous, strain sprain fracture also considered  Amount and/or Complexity of Data Reviewed Radiology: ordered.    Details: X-ray without evidence of effusion or acute fracture or dislocation Discussion of management or test  interpretation with external provider(s):   Possible ligamentous injury of the knee.  Knee brace applied, patient agreeable to symptomatic treatment recommended close outpatient follow-up with orthopedics.  He is agreeable to plan.  Risk Prescription drug management.        Final diagnoses:  Acute pain of right knee    ED Discharge Orders          Ordered    HYDROcodone -acetaminophen  (NORCO/VICODIN) 5-325 MG tablet        02/13/24 1936               Herlinda Milling, PA-C 02/16/24 1501    [name], MD 02/16/24 1732  "

## 2024-02-23 ENCOUNTER — Ambulatory Visit: Admitting: Orthopedic Surgery

## 2024-03-13 ENCOUNTER — Ambulatory Visit: Admitting: Orthopedic Surgery
